# Patient Record
Sex: Male | Born: 1950
Health system: Southern US, Community
[De-identification: ages and names within clinical notes are randomized; demographics above are authoritative.]

## PROBLEM LIST (undated history)

## (undated) DIAGNOSIS — E119 Type 2 diabetes mellitus without complications: Secondary | ICD-10-CM

## (undated) DIAGNOSIS — F419 Anxiety disorder, unspecified: Secondary | ICD-10-CM

## (undated) DIAGNOSIS — K219 Gastro-esophageal reflux disease without esophagitis: Secondary | ICD-10-CM

## (undated) DIAGNOSIS — T8859XA Other complications of anesthesia, initial encounter: Secondary | ICD-10-CM

## (undated) DIAGNOSIS — E039 Hypothyroidism, unspecified: Secondary | ICD-10-CM

## (undated) DIAGNOSIS — E079 Disorder of thyroid, unspecified: Secondary | ICD-10-CM

## (undated) DIAGNOSIS — M1990 Unspecified osteoarthritis, unspecified site: Secondary | ICD-10-CM

## (undated) DIAGNOSIS — E7800 Pure hypercholesterolemia, unspecified: Secondary | ICD-10-CM

## (undated) DIAGNOSIS — I10 Essential (primary) hypertension: Secondary | ICD-10-CM

## (undated) DIAGNOSIS — G4730 Sleep apnea, unspecified: Secondary | ICD-10-CM

## (undated) DIAGNOSIS — I639 Cerebral infarction, unspecified: Secondary | ICD-10-CM

## (undated) DIAGNOSIS — J189 Pneumonia, unspecified organism: Secondary | ICD-10-CM

## (undated) DIAGNOSIS — F329 Major depressive disorder, single episode, unspecified: Secondary | ICD-10-CM

## (undated) DIAGNOSIS — F32A Depression, unspecified: Secondary | ICD-10-CM

## (undated) DIAGNOSIS — Z87442 Personal history of urinary calculi: Secondary | ICD-10-CM

## (undated) DIAGNOSIS — T4145XA Adverse effect of unspecified anesthetic, initial encounter: Secondary | ICD-10-CM

## (undated) HISTORY — PX: OTHER SURGICAL HISTORY: SHX169

## (undated) HISTORY — DX: Cerebral infarction, unspecified: I63.9

## (undated) HISTORY — PX: KIDNEY STONE SURGERY: SHX686

---

## 2001-05-29 ENCOUNTER — Emergency Department (HOSPITAL_COMMUNITY): Admission: EM | Admit: 2001-05-29 | Discharge: 2001-05-29 | Payer: Self-pay | Admitting: *Deleted

## 2001-05-30 ENCOUNTER — Other Ambulatory Visit (HOSPITAL_COMMUNITY): Admission: RE | Admit: 2001-05-30 | Discharge: 2001-06-03 | Payer: Self-pay | Admitting: Psychiatry

## 2001-06-28 ENCOUNTER — Inpatient Hospital Stay (HOSPITAL_COMMUNITY): Admission: EM | Admit: 2001-06-28 | Discharge: 2001-07-02 | Payer: Self-pay | Admitting: Psychiatry

## 2001-07-01 ENCOUNTER — Ambulatory Visit (HOSPITAL_COMMUNITY): Admission: RE | Admit: 2001-07-01 | Discharge: 2001-07-01 | Payer: Self-pay | Admitting: Psychiatry

## 2001-07-01 ENCOUNTER — Encounter (HOSPITAL_COMMUNITY): Payer: Self-pay | Admitting: Psychiatry

## 2003-08-14 ENCOUNTER — Inpatient Hospital Stay (HOSPITAL_COMMUNITY): Admission: EM | Admit: 2003-08-14 | Discharge: 2003-08-14 | Payer: Self-pay | Admitting: Emergency Medicine

## 2003-08-27 ENCOUNTER — Encounter: Admission: RE | Admit: 2003-08-27 | Discharge: 2003-08-27 | Payer: Self-pay | Admitting: Family Medicine

## 2004-02-29 ENCOUNTER — Ambulatory Visit (HOSPITAL_COMMUNITY): Admission: RE | Admit: 2004-02-29 | Discharge: 2004-02-29 | Payer: Self-pay | Admitting: Otolaryngology

## 2005-03-04 ENCOUNTER — Other Ambulatory Visit: Admission: RE | Admit: 2005-03-04 | Discharge: 2005-03-04 | Payer: Self-pay | Admitting: Dermatology

## 2010-05-17 ENCOUNTER — Encounter: Payer: Self-pay | Admitting: Otolaryngology

## 2011-04-28 HISTORY — PX: BACK SURGERY: SHX140

## 2012-09-14 ENCOUNTER — Emergency Department (HOSPITAL_COMMUNITY)
Admission: EM | Admit: 2012-09-14 | Discharge: 2012-09-14 | Disposition: A | Discharge status: Home / Self Care | Payer: Worker's Compensation | Attending: Emergency Medicine | Admitting: Emergency Medicine

## 2012-09-14 ENCOUNTER — Encounter (HOSPITAL_COMMUNITY): Payer: Self-pay | Admitting: Family Medicine

## 2012-09-14 DIAGNOSIS — M545 Low back pain, unspecified: Secondary | ICD-10-CM | POA: Insufficient documentation

## 2012-09-14 DIAGNOSIS — Z862 Personal history of diseases of the blood and blood-forming organs and certain disorders involving the immune mechanism: Secondary | ICD-10-CM | POA: Insufficient documentation

## 2012-09-14 DIAGNOSIS — Z79899 Other long term (current) drug therapy: Secondary | ICD-10-CM | POA: Insufficient documentation

## 2012-09-14 DIAGNOSIS — Z9889 Other specified postprocedural states: Secondary | ICD-10-CM | POA: Insufficient documentation

## 2012-09-14 DIAGNOSIS — I10 Essential (primary) hypertension: Secondary | ICD-10-CM | POA: Insufficient documentation

## 2012-09-14 DIAGNOSIS — E7800 Pure hypercholesterolemia, unspecified: Secondary | ICD-10-CM | POA: Insufficient documentation

## 2012-09-14 DIAGNOSIS — F17200 Nicotine dependence, unspecified, uncomplicated: Secondary | ICD-10-CM | POA: Insufficient documentation

## 2012-09-14 DIAGNOSIS — G8921 Chronic pain due to trauma: Secondary | ICD-10-CM | POA: Insufficient documentation

## 2012-09-14 DIAGNOSIS — Z8639 Personal history of other endocrine, nutritional and metabolic disease: Secondary | ICD-10-CM | POA: Insufficient documentation

## 2012-09-14 DIAGNOSIS — M549 Dorsalgia, unspecified: Secondary | ICD-10-CM

## 2012-09-14 HISTORY — DX: Disorder of thyroid, unspecified: E07.9

## 2012-09-14 HISTORY — DX: Pure hypercholesterolemia, unspecified: E78.00

## 2012-09-14 HISTORY — DX: Essential (primary) hypertension: I10

## 2012-09-14 MED ORDER — OXYCODONE-ACETAMINOPHEN 5-325 MG PO TABS
1.0000 | ORAL_TABLET | Freq: Once | ORAL | Status: AC
  Administered 2012-09-14: 1 via ORAL
  Filled 2012-09-14 (×2): qty 1

## 2012-09-14 MED ORDER — OXYCODONE-ACETAMINOPHEN 5-325 MG PO TABS: 2.0000 | ORAL_TABLET | Freq: Four times a day (QID) | ORAL | Status: DC | PRN

## 2012-09-14 MED ORDER — KETOROLAC TROMETHAMINE 60 MG/2ML IM SOLN
60.0000 mg | Freq: Once | INTRAMUSCULAR | Status: AC
Start: 2012-09-14 — End: 2012-09-14
  Administered 2012-09-14: 60 mg via INTRAMUSCULAR
  Filled 2012-09-14 (×2): qty 2

## 2012-09-14 NOTE — ED Notes (Addendum)
Pt appears to be in severe pain. Lower back to right leg. Denies dumbness or tingling. Wife states "he almost passed out this morning". Pt is involved in a workman's comp injury in April. States had a previous WC injury in 2012.

## 2012-09-14 NOTE — ED Notes (Signed)
Pt dc to home.  Pt sts understanding to dc instructions. Pt ambulatory to exit without difficulty. Denies need for w/c. 

## 2012-09-14 NOTE — ED Notes (Signed)
Pt has had back injury since April and has had multiple injections. sts this morning bent over and the pain is worse. sts lower back pain and right hip pain.

## 2012-09-14 NOTE — ED Provider Notes (Signed)
History    This chart was scribed for non-physician practitioner, Roxy Horseman PA-C working with Vida Roller, MD by Donne Anon, ED Scribe. This patient was seen in room TR09C/TR09C and the patient's care was started at 1733.   CSN: [id number]  Arrival date & time 09/14/12  1637   First MD Initiated Contact with Patient 09/14/12 1733      Chief Complaint  Patient presents with  . Back Pain    The history is provided by the patient and the spouse. No language interpreter was used.   HPI Comments: Mathew Bernard is a 62 y.o. male who presents to the Emergency Department complaining of 2 years of gradual onset, gradually worsening, chronic severe back pain that radiates into his right leg and became exacerbated when he bent over to tie his shoe this morning. He states he injured his back at work in the fall of 2012 and has had pain since. He states walking makes the pain worse and pulling his legs to his chest relieve the pain. He denies incontinence. He denies any recent falls or pain that radiates down his legs. He has tried Prednisone and ibuprofen with little relief. He has had kidney stones in the past.  He saw Dr. Jillyn Hidden in 2012 for this pain.  Past Medical History  Diagnosis Date  . Hypertension   . High cholesterol   . Thyroid disease     Past Surgical History  Procedure Laterality Date  . Back surgery      History reviewed. No pertinent family history.  History  Substance Use Topics  . Smoking status: Current Every Day Smoker  . Smokeless tobacco: Not on file  . Alcohol Use: No      Review of Systems  Musculoskeletal: Positive for back pain.  All other systems reviewed and are negative.    Allergies  Codeine  Home Medications   Current Outpatient Rx  Name  Route  Sig  Dispense  Refill  . amLODipine-benazepril (LOTREL) 5-10 MG per capsule   Oral   Take 1 capsule by mouth at bedtime.         . cyanocobalamin 500 MCG tablet   Oral   Take 500  mcg by mouth daily.         . fish oil-omega-3 fatty acids 1000 MG capsule   Oral   Take 1 g by mouth daily.         . fluticasone (FLONASE) 50 MCG/ACT nasal spray   Nasal   Place 2 sprays into the nose daily.         Marland Kitchen ibuprofen (ADVIL,MOTRIN) 200 MG tablet   Oral   Take 200-800 mg by mouth every 6 (six) hours as needed for pain, fever or headache.         Marland Kitchen omeprazole (PRILOSEC) 20 MG capsule   Oral   Take 20 mg by mouth 2 (two) times daily.         . simvastatin (ZOCOR) 10 MG tablet   Oral   Take 5 mg by mouth at bedtime.         . predniSONE (STERAPRED UNI-PAK) 10 MG tablet   Oral   Take 10-60 mg by mouth See admin instructions. Taper dose 60-10mg          . sertraline (ZOLOFT) 100 MG tablet   Oral   Take 150 mg by mouth at bedtime.           BP 147/86  Pulse 62  Temp(Src) 97.5 F (36.4 C) (Oral)  Resp 24  SpO2 98%  Physical Exam  Nursing note and vitals reviewed. Constitutional: He is oriented to person, place, and time. He appears well-developed and well-nourished. No distress.  HENT:  Head: Normocephalic and atraumatic.  Eyes: Conjunctivae and EOM are normal. Right eye exhibits no discharge. Left eye exhibits no discharge. No scleral icterus.  Neck: Normal range of motion. Neck supple. No tracheal deviation present.  Cardiovascular: Normal rate, regular rhythm and normal heart sounds.  Exam reveals no gallop and no friction rub.   No murmur heard. Pulmonary/Chest: Effort normal and breath sounds normal. No respiratory distress. He has no wheezes.  Abdominal: Soft. He exhibits no distension. There is no tenderness.  Musculoskeletal: Normal range of motion.  Lumbar paraspinal muscles tender to palpation, no bony tenderness, step-offs, or gross abnormality or deformity of spine, patient is able to ambulate, moves all extremities  Neurological: He is alert and oriented to person, place, and time.  Sensation and strength intact bilaterally  Skin:  Skin is warm and dry. He is not diaphoretic.  Psychiatric: He has a normal mood and affect. His behavior is normal. Judgment and thought content normal.    ED Course  Procedures (including critical care time) DIAGNOSTIC STUDIES: Oxygen Saturation is 98% on room air, normal by my interpretation.    COORDINATION OF CARE: 6:51 PM Discussed treatment plan which includes Toradol and Percocet with pt at bedside and pt agreed to plan. Referral to orthopedics given.  7:51 PM rechecked pt who reports improvement upon treatment.       1. Back pain       MDM  Patient with back pain.  No neurological deficits and normal neuro exam.  Patient can walk but states is painful.  No loss of bowel or bladder control.  No concern for cauda equina.  No fever, night sweats, weight loss, h/o cancer, IVDU.  RICE protocol and pain medicine indicated and discussed with patient.    I personally performed the services described in this documentation, which was scribed in my presence. The recorded information has been reviewed and is accurate.         Roxy Horseman, PA-C 09/14/12 2340

## 2012-09-14 NOTE — ED Notes (Signed)
PA at bedside.

## 2012-09-14 NOTE — Discharge Instructions (Signed)
Back Pain, Adult  Low back pain is very common. About 1 in 5 people have back pain. The cause of low back pain is rarely dangerous. The pain often gets better over time. About half of people with a sudden onset of back pain feel better in just 2 weeks. About 8 in 10 people feel better by 6 weeks.   CAUSES  Some common causes of back pain include:  · Strain of the muscles or ligaments supporting the spine.  · Wear and tear (degeneration) of the spinal discs.  · Arthritis.  · Direct injury to the back.  DIAGNOSIS  Most of the time, the direct cause of low back pain is not known. However, back pain can be treated effectively even when the exact cause of the pain is unknown. Answering your caregiver's questions about your overall health and symptoms is one of the most accurate ways to make sure the cause of your pain is not dangerous. If your caregiver needs more information, he or she may order lab work or imaging tests (X-rays or MRIs). However, even if imaging tests show changes in your back, this usually does not require surgery.  HOME CARE INSTRUCTIONS  For many people, back pain returns. Since low back pain is rarely dangerous, it is often a condition that people can learn to manage on their own.   · Remain active. It is stressful on the back to sit or stand in one place. Do not sit, drive, or stand in one place for more than 30 minutes at a time. Take short walks on level surfaces as soon as pain allows. Try to increase the length of time you walk each day.  · Do not stay in bed. Resting more than 1 or 2 days can delay your recovery.  · Do not avoid exercise or work. Your body is made to move. It is not dangerous to be active, even though your back may hurt. Your back will likely heal faster if you return to being active before your pain is gone.  · Pay attention to your body when you  bend and lift. Many people have less discomfort when lifting if they bend their knees, keep the load close to their bodies, and  avoid twisting. Often, the most comfortable positions are those that put less stress on your recovering back.  · Find a comfortable position to sleep. Use a firm mattress and lie on your side with your knees slightly bent. If you lie on your back, put a pillow under your knees.  · Only take over-the-counter or prescription medicines as directed by your caregiver. Over-the-counter medicines to reduce pain and inflammation are often the most helpful. Your caregiver may prescribe muscle relaxant drugs. These medicines help dull your pain so you can more quickly return to your normal activities and healthy exercise.  · Put ice on the injured area.  · Put ice in a plastic bag.  · Place a towel between your skin and the bag.  · Leave the ice on for 15-20 minutes, 3-4 times a day for the first 2 to 3 days. After that, ice and heat may be alternated to reduce pain and spasms.  · Ask your caregiver about trying back exercises and gentle massage. This may be of some benefit.  · Avoid feeling anxious or stressed. Stress increases muscle tension and can worsen back pain. It is important to recognize when you are anxious or stressed and learn ways to manage it. Exercise is a great option.  SEEK MEDICAL CARE IF:  · You have pain that is not relieved with rest or   medicine.  · You have pain that does not improve in 1 week.  · You have new symptoms.  · You are generally not feeling well.  SEEK IMMEDIATE MEDICAL CARE IF:   · You have pain that radiates from your back into your legs.  · You develop new bowel or bladder control problems.  · You have unusual weakness or numbness in your arms or legs.  · You develop nausea or vomiting.  · You develop abdominal pain.  · You feel faint.  Document Released: 04/13/2005 Document Revised: 10/13/2011 Document Reviewed: 09/01/2010  [employer]® Patient Information ©2014 [employer].

## 2012-09-15 NOTE — ED Provider Notes (Signed)
Medical screening examination/treatment/procedure(s) were performed by non-physician practitioner and as supervising physician I was immediately available for consultation/collaboration.    Vida Roller, MD 09/15/12 1357

## 2012-09-17 ENCOUNTER — Emergency Department (HOSPITAL_COMMUNITY): Payer: Worker's Compensation

## 2012-09-17 ENCOUNTER — Encounter (HOSPITAL_COMMUNITY): Payer: Self-pay | Admitting: *Deleted

## 2012-09-17 ENCOUNTER — Emergency Department (HOSPITAL_COMMUNITY)
Admission: EM | Admit: 2012-09-17 | Discharge: 2012-09-17 | Disposition: A | Discharge status: Home / Self Care | Payer: Worker's Compensation | Attending: Emergency Medicine | Admitting: Emergency Medicine

## 2012-09-17 DIAGNOSIS — Z9889 Other specified postprocedural states: Secondary | ICD-10-CM | POA: Insufficient documentation

## 2012-09-17 DIAGNOSIS — Z8639 Personal history of other endocrine, nutritional and metabolic disease: Secondary | ICD-10-CM | POA: Insufficient documentation

## 2012-09-17 DIAGNOSIS — E7800 Pure hypercholesterolemia, unspecified: Secondary | ICD-10-CM | POA: Insufficient documentation

## 2012-09-17 DIAGNOSIS — Z79899 Other long term (current) drug therapy: Secondary | ICD-10-CM | POA: Insufficient documentation

## 2012-09-17 DIAGNOSIS — IMO0002 Reserved for concepts with insufficient information to code with codable children: Secondary | ICD-10-CM | POA: Insufficient documentation

## 2012-09-17 DIAGNOSIS — F17200 Nicotine dependence, unspecified, uncomplicated: Secondary | ICD-10-CM | POA: Insufficient documentation

## 2012-09-17 DIAGNOSIS — M5417 Radiculopathy, lumbosacral region: Secondary | ICD-10-CM

## 2012-09-17 DIAGNOSIS — Z862 Personal history of diseases of the blood and blood-forming organs and certain disorders involving the immune mechanism: Secondary | ICD-10-CM | POA: Insufficient documentation

## 2012-09-17 DIAGNOSIS — I10 Essential (primary) hypertension: Secondary | ICD-10-CM | POA: Insufficient documentation

## 2012-09-17 DIAGNOSIS — R209 Unspecified disturbances of skin sensation: Secondary | ICD-10-CM | POA: Insufficient documentation

## 2012-09-17 LAB — URINALYSIS, ROUTINE W REFLEX MICROSCOPIC
Glucose, UA: NEGATIVE mg/dL
Leukocytes, UA: NEGATIVE
Nitrite: NEGATIVE
Specific Gravity, Urine: >1.03 — ABNORMAL HIGH (ref 1.005–1.030)
pH: 6 (ref 5.0–8.0)

## 2012-09-17 MED ORDER — DOCUSATE SODIUM 100 MG PO CAPS: 100.0000 mg | ORAL_CAPSULE | Freq: Two times a day (BID) | ORAL | Status: DC

## 2012-09-17 MED ORDER — HYDROMORPHONE HCL PF 1 MG/ML IJ SOLN
1.0000 mg | Freq: Once | INTRAMUSCULAR | Status: AC
  Administered 2012-09-17: 1 mg via INTRAVENOUS
  Filled 2012-09-17: qty 1

## 2012-09-17 MED ORDER — HYDROMORPHONE HCL PF 1 MG/ML IJ SOLN
1.0000 mg | Freq: Once | INTRAMUSCULAR | Status: AC
  Administered 2012-09-17: 1 mg via INTRAVENOUS

## 2012-09-17 MED ORDER — CYCLOBENZAPRINE HCL 10 MG PO TABS: 10.0000 mg | ORAL_TABLET | Freq: Two times a day (BID) | ORAL | Status: DC | PRN

## 2012-09-17 NOTE — ED Notes (Signed)
Lower back pain radiating down right leg.

## 2012-09-17 NOTE — ED Provider Notes (Signed)
History     CSN: [id number]  Arrival date & time 09/17/12  0039   First MD Initiated Contact with Patient 09/17/12 0055      Chief Complaint  Patient presents with  . Back Pain     Patient is a 62 y.o. male presenting with back pain. The history is provided by the patient, the spouse and a relative.  Back Pain Location:  Lumbar spine Quality:  Aching Radiates to:  R thigh Pain severity:  Severe Pain is:  Same all the time Onset quality:  Gradual Duration: several days ago. Timing:  Constant Progression:  Worsening Relieved by:  Nothing Worsened by:  Ambulation and movement Associated symptoms: numbness   Associated symptoms: no abdominal pain, no bladder incontinence, no bowel incontinence, no chest pain, no dysuria, no fever, no perianal numbness and no weakness   Risk factors: no hx of cancer   pt reports he has long h/o back pain. He reports that end of April 2014 he picked up something at work that triggered return of back pain He reports that on 5/21 he bent over to tie shoes and this aggravated his pain He was seen in the ED on 5/21 and then seen by his orthopedist dr Shelle Iron since that time Plan is for MRI of spine next week He reports h/o neck surgery but no low back surgery No recent trauma to back No h/o cancer No falls Reports pain/numbness into right leg mostly on lateral aspect of leg   Past Medical History  Diagnosis Date  . Hypertension   . High cholesterol   . Thyroid disease     Past Surgical History  Procedure Laterality Date  . Back surgery      History reviewed. No pertinent family history.  History  Substance Use Topics  . Smoking status: Current Every Day Smoker  . Smokeless tobacco: Not on file  . Alcohol Use: No      Review of Systems  Constitutional: Negative for fever.  Respiratory: Negative for shortness of breath.   Cardiovascular: Negative for chest pain.  Gastrointestinal: Negative for vomiting, abdominal pain and bowel  incontinence.  Genitourinary: Negative for bladder incontinence, dysuria and difficulty urinating.  Musculoskeletal: Positive for back pain.  Neurological: Positive for numbness. Negative for weakness.  All other systems reviewed and are negative.    Allergies  Codeine  Home Medications   Current Outpatient Rx  Name  Route  Sig  Dispense  Refill  . amLODipine-benazepril (LOTREL) 5-10 MG per capsule   Oral   Take 1 capsule by mouth at bedtime.         . cyanocobalamin 500 MCG tablet   Oral   Take 500 mcg by mouth daily.         . cyclobenzaprine (FLEXERIL) 10 MG tablet   Oral   Take 1 tablet (10 mg total) by mouth 2 (two) times daily as needed for muscle spasms.   20 tablet   0   . docusate sodium (COLACE) 100 MG capsule   Oral   Take 1 capsule (100 mg total) by mouth every 12 (twelve) hours.   60 capsule   0   . fish oil-omega-3 fatty acids 1000 MG capsule   Oral   Take 1 g by mouth daily.         . fluticasone (FLONASE) 50 MCG/ACT nasal spray   Nasal   Place 2 sprays into the nose daily.         Marland Kitchen  ibuprofen (ADVIL,MOTRIN) 200 MG tablet   Oral   Take 200-800 mg by mouth every 6 (six) hours as needed for pain, fever or headache.         Marland Kitchen omeprazole (PRILOSEC) 20 MG capsule   Oral   Take 20 mg by mouth 2 (two) times daily.         Marland Kitchen oxyCODONE-acetaminophen (PERCOCET/ROXICET) 5-325 MG per tablet   Oral   Take 2 tablets by mouth every 6 (six) hours as needed for pain.   15 tablet   0   . predniSONE (STERAPRED UNI-PAK) 10 MG tablet   Oral   Take 10-60 mg by mouth See admin instructions. Taper dose 60-10mg          . sertraline (ZOLOFT) 100 MG tablet   Oral   Take 150 mg by mouth at bedtime.         . simvastatin (ZOCOR) 10 MG tablet   Oral   Take 5 mg by mouth at bedtime.           BP 155/90  Pulse 55  Temp(Src) 97.8 F (36.6 C) (Oral)  Resp 22  Ht 5\' 8"  (1.727 m)  Wt 220 lb (99.791 kg)  BMI 33.46 kg/m2  SpO2 94%  Physical  Exam CONSTITUTIONAL: Well developed/well nourished HEAD: Normocephalic/atraumatic EYES: EOMI/PERRL ENMT: Mucous membranes moist NECK: supple no meningeal signs SPINE:entire spine nontender, lumbar paraspinal tenderness No bruising/crepitance/stepoffs noted to spine CV: S1/S2 noted, no murmurs/rubs/gallops noted LUNGS: Lungs are clear to auscultation bilaterally, no apparent distress ABDOMEN: soft, nontender, no rebound or guarding GU:no cva tenderness NEURO: Awake/alert, no saddle anesthesia,equal distal motor: hip flexion/knee flexion/extension, ankle dorsi/plantar flexion, great toe extension intact bilaterally, no clonus bilaterally, plantar reflex appropriate,  Equal patellar/achilles reflex noted.  Pt is able to ambulate. He reports burning numbness along lateral aspect of right thigh/calf EXTREMITIES: pulses normal, full ROM SKIN: warm, color normal PSYCH: no abnormalities of mood noted   ED Course  Procedures   Labs Reviewed  URINALYSIS, ROUTINE W REFLEX MICROSCOPIC - Abnormal; Notable for the following:    Specific Gravity, Urine >1.030 (*)    All other components within normal limits   Dg Lumbar Spine Complete  09/17/2012   *RADIOLOGY REPORT*  Clinical Data: Low back pain  LUMBAR SPINE - COMPLETE 4+ VIEW  Comparison: CT 11/06/2011  Findings: Five non-rib bearing lumbar type vertebral bodies are identified.  Normal alignment.  Atherosclerotic aortic calcification again noted.  No calcified aneurysm.  Vertebral body heights are preserved.  No malalignment.  IMPRESSION: Normal exam.   Original Report Authenticated By: Christiana Pellant, M.D.     1. Lumbosacral radiculopathy    I suspect worsening lumbosacral radiculopathy He has no focal motor deficits IV pain meds given here.  He is ambulatory He has pain meds at home (given 7/5/325 percocet by his orthopedist) I advised he can increase to two tabs q 6-8 hours and to hold if feels sedated.  He also has robaxin at home which  is not helping Will add on flexeril but advised of sedation and to be cautious when taking with narcotics and not take with robaxin I do not feel he needs emergent MRI or neurosurgical consulation He has f/u next week with his orthopedist I doubt acute abdominal/vascular emergency at this time   MDM  Nursing notes including past medical history and social history reviewed and considered in documentation xrays reviewed and considered Labs/vital reviewed and considered Previous records reviewed and considered - recent ED  visit reviewed         Joya Gaskins, MD 09/17/12 [phone]

## 2012-10-03 ENCOUNTER — Other Ambulatory Visit: Payer: Self-pay | Admitting: Orthopedic Surgery

## 2012-10-03 ENCOUNTER — Encounter (HOSPITAL_COMMUNITY): Payer: Self-pay | Admitting: Pharmacy Technician

## 2012-10-03 NOTE — H&P (Signed)
Mathew Bernard is an 62 y.o. male.   Chief Complaint: back and right leg pain HPI: The patient reports low back pain which began 6 weeks ago following a specific injury (08/22/12). The injury occurred at work due to twisting, lifting and bending. The pain radiates to the right posterior thigh. The patient describes the pain as sharp and severe. The patient feels as if the symptoms are worsening. Symptoms are exacerbated by bending. Current treatment includes ice packs and TENS unit. He was seen by a doctor in [place] at [facility] and recieved two steroid injection of to the lumbar spine. Symptoms refractory to Percocet, activity modification, ESI x 2, relative rest  Past Medical History  Diagnosis Date  . Hypertension   . High cholesterol   . Thyroid disease     Past Surgical History  Procedure Laterality Date  . Back surgery      No family history on file. Social History:  reports that he has been smoking.  He does not have any smokeless tobacco history on file. He reports that he does not drink alcohol. His drug history is not on file.  Allergies:  Allergies  Allergen Reactions  . Codeine     Jittery      (Not in a hospital admission)  No results found for this or any previous visit (from the past 48 hour(s)). No results found.  Review of Systems  Constitutional: Negative.   Eyes: Negative.   Respiratory: Negative.   Cardiovascular: Negative.   Gastrointestinal: Negative.   Genitourinary: Negative.   Musculoskeletal: Positive for back pain.  Skin: Negative.   Neurological: Positive for sensory change and focal weakness.  Psychiatric/Behavioral: Negative.     There were no vitals taken for this visit. Physical Exam  Constitutional: He is oriented to person, place, and time. He appears well-developed and well-nourished. He appears distressed.  HENT:  Head: Normocephalic and atraumatic.  Eyes: Conjunctivae and EOM are normal. Pupils are equal, round, and reactive to  light.  Neck: Normal range of motion. Neck supple.  Cardiovascular: Normal rate and regular rhythm.   Respiratory: Effort normal and breath sounds normal.  GI: Soft. Bowel sounds are normal.  Musculoskeletal:  The patient is in moderate distress. Mood and affect are appropriate. Straight leg raise on the right produces buttock, thigh, and calf pain, exacerbated with dorsal argumentation maneuver, negative on the left. EHL is 4+/5. Decreased sensation L5 dermatome.  Lumbar spine exam reveals no evidence of soft tissue swelling, no evidence of soft tissue swelling or deformity or skin ecchymosis. On palpation there is no tenderness of the lumbar spine. No flank pain with percussion. The abdomen is soft and nontender. Nontender over the trochanters. No cellulitis or lymphadenopathy.   Good range of motion of the lumbar spine without associated pain. Motor is 5/5 including tibialis anterior, plantarflexion, quadriceps and hamstrings. The patient is normoreflexic. There is no Babinski or clonus. The patient has good distal pulses. No DVT. No pain and normal range of motion without instability of the hips, knees and ankles.  Neurological: He is alert and oriented to person, place, and time. He has normal reflexes.  Skin: Skin is warm and dry.  Psychiatric: He has a normal mood and affect.     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  After an extensive discussion concerning pathology, relevant anatomy, and treatment options at this point in the presence of neurological deficit failing   conservative treatment in the presence of a neural compressive lesion, we discussed lumbar decompression.  We discussed two weeks postoperatively suture removal, physical therapy within two weeks up until six weeks two to three times a week. At the six week mark then engage in a work conditioning  program to a twelve week postoperative mark at which point he will be eligible for maximum medical improvement. I do feel this is related to his initial injury given his twisting and lifting.  I had an extensive discussion of the risks and benefits of lumbar decompression with the patient including bleeding, infection, damage to neurovascular structures, epidural fibrosis, CSF leakage requiring repair. We also discussed increase in pain, adjacent segment disease, recurrent disc herniation, need for future surgery including repeat decompression and/or fusion. We also discussed risks of postoperative hematoma, paralysis, anesthetic complications including DVT, PE, death, cardiopulmonary dysfunction. In addition, the perioperative and postoperative courses were discussed in detail including the rehabilitative time and return to functional activity and work. I provided the patient with an illustrated handout and utilized the appropriate surgical models.  Plan microlumbar decompression L4-5  BISSELL, JACLYN M. for Dr. Beane 10/03/2012, 9:28 AM    

## 2012-10-03 NOTE — Progress Notes (Signed)
LOV note 09/28/12 Dr. Neita Carp on chart, chest x-ray 03/25/12 on chart.

## 2012-10-03 NOTE — Patient Instructions (Addendum)
[address] Juba Morken  10/03/2012   Your procedure is scheduled on: 10/07/12  Report to [facility] at 12:00 PM.  Call this number if you have problems the morning of surgery [phone]: [phone]   Remember: please bring inhaler on day of surgery    Do not eat food After Midnight, clear liquids from midnight until 8:30 am on 10/07/12 then nothing.      Take these medicines the morning of surgery with A SIP OF WATER: omeprazole, synthroid, zyrtec, oxycodone if needed   Do not wear jewelry, make-up or nail polish.  Do not wear lotions, powders, or perfumes. You may wear deodorant.  Do not shave 48 hours prior to surgery. Men may shave face and neck.  Do not bring valuables to the hospital.  Contacts, dentures or bridgework may not be worn into surgery.  Leave suitcase in the car. After surgery it may be brought to your room.  For patients admitted to the hospital, checkout time is 11:00 AM the day of discharge.    Please read over the following fact sheets that you were given: MRSA Information.  Birdie Sons, RN  pre op nurse call if needed [phone]    FAILURE TO FOLLOW THESE INSTRUCTIONS MAY RESULT IN CANCELLATION OF YOUR SURGERY   Patient Signature: ___________________________________________

## 2012-10-03 NOTE — Pre-Procedure Instructions (Signed)
Please put oreders in Epic as patient has Pre-op appointment on 10/04/2012 at 10am.! Thank you!

## 2012-10-04 ENCOUNTER — Ambulatory Visit (HOSPITAL_COMMUNITY)
Admission: RE | Admit: 2012-10-04 | Discharge: 2012-10-04 | Disposition: A | Discharge status: Home / Self Care | Payer: Worker's Compensation | Source: Ambulatory Visit | Attending: Orthopedic Surgery | Admitting: Orthopedic Surgery

## 2012-10-04 ENCOUNTER — Encounter (HOSPITAL_COMMUNITY)
Admission: RE | Admit: 2012-10-04 | Discharge: 2012-10-04 | Disposition: A | Discharge status: Home / Self Care | Payer: Worker's Compensation | Source: Ambulatory Visit | Attending: Specialist | Admitting: Specialist

## 2012-10-04 ENCOUNTER — Encounter (HOSPITAL_COMMUNITY): Payer: Self-pay

## 2012-10-04 DIAGNOSIS — Z01812 Encounter for preprocedural laboratory examination: Secondary | ICD-10-CM | POA: Insufficient documentation

## 2012-10-04 DIAGNOSIS — Z01818 Encounter for other preprocedural examination: Secondary | ICD-10-CM | POA: Insufficient documentation

## 2012-10-04 DIAGNOSIS — Z01810 Encounter for preprocedural cardiovascular examination: Secondary | ICD-10-CM | POA: Insufficient documentation

## 2012-10-04 DIAGNOSIS — M5137 Other intervertebral disc degeneration, lumbosacral region: Secondary | ICD-10-CM | POA: Insufficient documentation

## 2012-10-04 DIAGNOSIS — M51379 Other intervertebral disc degeneration, lumbosacral region without mention of lumbar back pain or lower extremity pain: Secondary | ICD-10-CM | POA: Insufficient documentation

## 2012-10-04 HISTORY — DX: Hypothyroidism, unspecified: E03.9

## 2012-10-04 HISTORY — DX: Adverse effect of unspecified anesthetic, initial encounter: T41.45XA

## 2012-10-04 HISTORY — DX: Sleep apnea, unspecified: G47.30

## 2012-10-04 HISTORY — DX: Pneumonia, unspecified organism: J18.9

## 2012-10-04 HISTORY — DX: Other complications of anesthesia, initial encounter: T88.59XA

## 2012-10-04 HISTORY — DX: Personal history of urinary calculi: Z87.442

## 2012-10-04 HISTORY — DX: Anxiety disorder, unspecified: F41.9

## 2012-10-04 HISTORY — DX: Unspecified osteoarthritis, unspecified site: M19.90

## 2012-10-04 HISTORY — DX: Major depressive disorder, single episode, unspecified: F32.9

## 2012-10-04 HISTORY — DX: Depression, unspecified: F32.A

## 2012-10-04 HISTORY — DX: Gastro-esophageal reflux disease without esophagitis: K21.9

## 2012-10-04 LAB — CBC
HCT: 44.9 % (ref 39.0–52.0)
MCV: 82.8 fL (ref 78.0–100.0)
RBC: 5.42 MIL/uL (ref 4.22–5.81)
RDW: 14.6 % (ref 11.5–15.5)
WBC: 6.4 10*3/uL (ref 4.0–10.5)

## 2012-10-04 LAB — BASIC METABOLIC PANEL
CO2: 27 mEq/L (ref 19–32)
Chloride: 103 mEq/L (ref 96–112)
GFR calc Af Amer: 75 mL/min — ABNORMAL LOW (ref >90)
Potassium: 4.7 mEq/L (ref 3.5–5.1)
Sodium: 137 mEq/L (ref 135–145)

## 2012-10-04 LAB — SURGICAL PCR SCREEN: Staphylococcus aureus: NEGATIVE

## 2012-10-07 ENCOUNTER — Ambulatory Visit (HOSPITAL_COMMUNITY)
Admission: RE | Admit: 2012-10-07 | Discharge: 2012-10-08 | Disposition: A | Discharge status: Home / Self Care | Payer: Worker's Compensation | Source: Ambulatory Visit | Attending: Specialist | Admitting: Specialist

## 2012-10-07 ENCOUNTER — Ambulatory Visit (HOSPITAL_COMMUNITY): Payer: Worker's Compensation

## 2012-10-07 ENCOUNTER — Encounter (HOSPITAL_COMMUNITY)
Admission: RE | Disposition: A | Discharge status: Home / Self Care | Payer: Self-pay | Source: Ambulatory Visit | Attending: Specialist

## 2012-10-07 ENCOUNTER — Ambulatory Visit (HOSPITAL_COMMUNITY): Payer: Worker's Compensation | Admitting: Anesthesiology

## 2012-10-07 ENCOUNTER — Encounter (HOSPITAL_COMMUNITY): Payer: Self-pay | Admitting: Anesthesiology

## 2012-10-07 ENCOUNTER — Encounter (HOSPITAL_COMMUNITY): Payer: Self-pay

## 2012-10-07 DIAGNOSIS — E079 Disorder of thyroid, unspecified: Secondary | ICD-10-CM | POA: Insufficient documentation

## 2012-10-07 DIAGNOSIS — M5126 Other intervertebral disc displacement, lumbar region: Secondary | ICD-10-CM

## 2012-10-07 DIAGNOSIS — E7800 Pure hypercholesterolemia, unspecified: Secondary | ICD-10-CM | POA: Insufficient documentation

## 2012-10-07 DIAGNOSIS — F17200 Nicotine dependence, unspecified, uncomplicated: Secondary | ICD-10-CM | POA: Insufficient documentation

## 2012-10-07 DIAGNOSIS — I10 Essential (primary) hypertension: Secondary | ICD-10-CM | POA: Insufficient documentation

## 2012-10-07 DIAGNOSIS — G4730 Sleep apnea, unspecified: Secondary | ICD-10-CM | POA: Insufficient documentation

## 2012-10-07 DIAGNOSIS — E669 Obesity, unspecified: Secondary | ICD-10-CM | POA: Insufficient documentation

## 2012-10-07 DIAGNOSIS — K219 Gastro-esophageal reflux disease without esophagitis: Secondary | ICD-10-CM | POA: Insufficient documentation

## 2012-10-07 HISTORY — PX: LUMBAR LAMINECTOMY/DECOMPRESSION MICRODISCECTOMY: SHX5026

## 2012-10-07 SURGERY — LUMBAR LAMINECTOMY/DECOMPRESSION MICRODISCECTOMY 1 LEVEL
Anesthesia: General | Site: Back | Wound class: Clean

## 2012-10-07 MED ORDER — METHOCARBAMOL 100 MG/ML IJ SOLN
500.0000 mg | Freq: Four times a day (QID) | INTRAVENOUS | Status: DC | PRN
  Administered 2012-10-07: 500 mg via INTRAVENOUS
  Filled 2012-10-07 (×2): qty 5

## 2012-10-07 MED ORDER — MENTHOL 3 MG MT LOZG: 1.0000 | LOZENGE | OROMUCOSAL | Status: DC | PRN

## 2012-10-07 MED ORDER — PROMETHAZINE HCL 25 MG/ML IJ SOLN: 6.2500 mg | INTRAMUSCULAR | Status: DC | PRN

## 2012-10-07 MED ORDER — HYDROCODONE-ACETAMINOPHEN 5-325 MG PO TABS
1.0000 | ORAL_TABLET | ORAL | Status: DC | PRN
  Administered 2012-10-08: 1 via ORAL
  Filled 2012-10-07: qty 1

## 2012-10-07 MED ORDER — PROPOFOL 10 MG/ML IV BOLUS
INTRAVENOUS | Status: DC | PRN
  Administered 2012-10-07: 180 mg via INTRAVENOUS

## 2012-10-07 MED ORDER — AMLODIPINE BESYLATE 5 MG PO TABS
5.0000 mg | ORAL_TABLET | Freq: Every day | ORAL | Status: DC
  Administered 2012-10-07: 5 mg via ORAL
  Filled 2012-10-07 (×2): qty 1

## 2012-10-07 MED ORDER — ACETAMINOPHEN 10 MG/ML IV SOLN
INTRAVENOUS | Status: DC | PRN
  Administered 2012-10-07: 1000 mg via INTRAVENOUS

## 2012-10-07 MED ORDER — BUPIVACAINE-EPINEPHRINE 0.5% -1:200000 IJ SOLN
INTRAMUSCULAR | Status: DC | PRN
  Administered 2012-10-07: 20 mL

## 2012-10-07 MED ORDER — SODIUM CHLORIDE 0.9 % IJ SOLN
3.0000 mL | Freq: Two times a day (BID) | INTRAMUSCULAR | Status: DC
  Administered 2012-10-07: 3 mL via INTRAVENOUS

## 2012-10-07 MED ORDER — FENTANYL CITRATE 0.05 MG/ML IJ SOLN
INTRAMUSCULAR | Status: DC | PRN
  Administered 2012-10-07: 50 ug via INTRAVENOUS
  Administered 2012-10-07: 25 ug via INTRAVENOUS
  Administered 2012-10-07: 50 ug via INTRAVENOUS
  Administered 2012-10-07: 100 ug via INTRAVENOUS
  Administered 2012-10-07: 25 ug via INTRAVENOUS

## 2012-10-07 MED ORDER — SODIUM CHLORIDE 0.9 % IJ SOLN: 3.0000 mL | INTRAMUSCULAR | Status: DC | PRN

## 2012-10-07 MED ORDER — SODIUM CHLORIDE 0.9 % IR SOLN
Status: DC | PRN
  Administered 2012-10-07: 16:00:00

## 2012-10-07 MED ORDER — DOCUSATE SODIUM 100 MG PO CAPS: 100.0000 mg | ORAL_CAPSULE | Freq: Two times a day (BID) | ORAL | Status: DC

## 2012-10-07 MED ORDER — AMLODIPINE BESY-BENAZEPRIL HCL 5-10 MG PO CAPS: 1.0000 | ORAL_CAPSULE | Freq: Every day | ORAL | Status: DC

## 2012-10-07 MED ORDER — HYDROMORPHONE HCL PF 1 MG/ML IJ SOLN
0.2500 mg | INTRAMUSCULAR | Status: DC | PRN
  Administered 2012-10-07 (×4): 0.5 mg via INTRAVENOUS

## 2012-10-07 MED ORDER — ACETAMINOPHEN 10 MG/ML IV SOLN
INTRAVENOUS | Status: AC
  Filled 2012-10-07: qty 100

## 2012-10-07 MED ORDER — PANTOPRAZOLE SODIUM 40 MG PO TBEC
40.0000 mg | DELAYED_RELEASE_TABLET | Freq: Every day | ORAL | Status: DC
  Administered 2012-10-08: 40 mg via ORAL
  Filled 2012-10-07: qty 1

## 2012-10-07 MED ORDER — PHENOL 1.4 % MT LIQD: 1.0000 | OROMUCOSAL | Status: DC | PRN

## 2012-10-07 MED ORDER — BUPIVACAINE-EPINEPHRINE 0.5% -1:200000 IJ SOLN
INTRAMUSCULAR | Status: AC
  Filled 2012-10-07: qty 1

## 2012-10-07 MED ORDER — DOCUSATE SODIUM 100 MG PO CAPS
100.0000 mg | ORAL_CAPSULE | Freq: Two times a day (BID) | ORAL | Status: DC
  Administered 2012-10-07 – 2012-10-08 (×2): 100 mg via ORAL

## 2012-10-07 MED ORDER — CYANOCOBALAMIN 500 MCG PO TABS
500.0000 ug | ORAL_TABLET | Freq: Every day | ORAL | Status: DC
  Administered 2012-10-08: 500 ug via ORAL
  Filled 2012-10-07: qty 1

## 2012-10-07 MED ORDER — OXYCODONE-ACETAMINOPHEN 7.5-325 MG PO TABS: 1.0000 | ORAL_TABLET | ORAL | Status: DC | PRN

## 2012-10-07 MED ORDER — FLUTICASONE PROPIONATE 50 MCG/ACT NA SUSP
2.0000 | Freq: Every day | NASAL | Status: DC
  Administered 2012-10-07: 2 via NASAL
  Filled 2012-10-07: qty 16

## 2012-10-07 MED ORDER — ONDANSETRON HCL 4 MG/2ML IJ SOLN
4.0000 mg | INTRAMUSCULAR | Status: DC | PRN
  Administered 2012-10-08: 4 mg via INTRAVENOUS
  Filled 2012-10-07: qty 2

## 2012-10-07 MED ORDER — SERTRALINE HCL 100 MG PO TABS
150.0000 mg | ORAL_TABLET | Freq: Every day | ORAL | Status: DC
  Administered 2012-10-07: 150 mg via ORAL
  Filled 2012-10-07 (×2): qty 1

## 2012-10-07 MED ORDER — GLYCOPYRROLATE 0.2 MG/ML IJ SOLN
INTRAMUSCULAR | Status: DC | PRN
  Administered 2012-10-07: 0.4 mg via INTRAVENOUS

## 2012-10-07 MED ORDER — SODIUM CHLORIDE 0.9 % IV SOLN: 250.0000 mL | INTRAVENOUS | Status: DC

## 2012-10-07 MED ORDER — FENTANYL CITRATE 0.05 MG/ML IJ SOLN
INTRAMUSCULAR | Status: AC
  Filled 2012-10-07: qty 2

## 2012-10-07 MED ORDER — ALBUTEROL SULFATE HFA 108 (90 BASE) MCG/ACT IN AERS: 2.0000 | INHALATION_SPRAY | Freq: Four times a day (QID) | RESPIRATORY_TRACT | Status: DC | PRN

## 2012-10-07 MED ORDER — OXYCODONE-ACETAMINOPHEN 5-325 MG PO TABS: 1.0000 | ORAL_TABLET | ORAL | Status: DC | PRN

## 2012-10-07 MED ORDER — CEFAZOLIN SODIUM-DEXTROSE 2-3 GM-% IV SOLR
INTRAVENOUS | Status: AC
  Filled 2012-10-07: qty 50

## 2012-10-07 MED ORDER — THROMBIN 5000 UNITS EX SOLR
CUTANEOUS | Status: DC | PRN
  Administered 2012-10-07: 10000 [IU] via TOPICAL

## 2012-10-07 MED ORDER — AMLODIPINE BESYLATE 5 MG PO TABS
5.0000 mg | ORAL_TABLET | Freq: Once | ORAL | Status: DC
  Filled 2012-10-07: qty 1

## 2012-10-07 MED ORDER — BENAZEPRIL HCL 10 MG PO TABS
10.0000 mg | ORAL_TABLET | Freq: Every day | ORAL | Status: DC
  Administered 2012-10-07: 10 mg via ORAL
  Filled 2012-10-07 (×2): qty 1

## 2012-10-07 MED ORDER — HYDROMORPHONE HCL PF 1 MG/ML IJ SOLN
0.5000 mg | INTRAMUSCULAR | Status: DC | PRN
Start: 2012-10-07 — End: 2012-10-08
  Administered 2012-10-08: 1 mg via INTRAVENOUS
  Filled 2012-10-07: qty 1

## 2012-10-07 MED ORDER — CEFAZOLIN SODIUM-DEXTROSE 2-3 GM-% IV SOLR
2.0000 g | Freq: Three times a day (TID) | INTRAVENOUS | Status: AC
  Administered 2012-10-07 – 2012-10-08 (×2): 2 g via INTRAVENOUS
  Filled 2012-10-07 (×2): qty 50

## 2012-10-07 MED ORDER — HYDROMORPHONE HCL PF 1 MG/ML IJ SOLN
INTRAMUSCULAR | Status: AC
  Filled 2012-10-07: qty 1

## 2012-10-07 MED ORDER — METHOCARBAMOL 500 MG PO TABS
500.0000 mg | ORAL_TABLET | Freq: Four times a day (QID) | ORAL | Status: DC | PRN
  Administered 2012-10-08: 500 mg via ORAL
  Filled 2012-10-07: qty 1

## 2012-10-07 MED ORDER — LORATADINE 10 MG PO TABS
10.0000 mg | ORAL_TABLET | Freq: Every day | ORAL | Status: DC
  Administered 2012-10-08: 10 mg via ORAL
  Filled 2012-10-07: qty 1

## 2012-10-07 MED ORDER — SODIUM CHLORIDE 0.45 % IV SOLN
INTRAVENOUS | Status: DC
  Administered 2012-10-07: 20:00:00 via INTRAVENOUS

## 2012-10-07 MED ORDER — CISATRACURIUM BESYLATE (PF) 10 MG/5ML IV SOLN
INTRAVENOUS | Status: DC | PRN
  Administered 2012-10-07: 10 mg via INTRAVENOUS

## 2012-10-07 MED ORDER — CEFAZOLIN SODIUM-DEXTROSE 2-3 GM-% IV SOLR
2.0000 g | INTRAVENOUS | Status: AC
  Administered 2012-10-07: 2 g via INTRAVENOUS

## 2012-10-07 MED ORDER — MIDAZOLAM HCL 5 MG/5ML IJ SOLN
INTRAMUSCULAR | Status: DC | PRN
  Administered 2012-10-07: 2 mg via INTRAVENOUS

## 2012-10-07 MED ORDER — THROMBIN 5000 UNITS EX SOLR
CUTANEOUS | Status: AC
  Filled 2012-10-07: qty 10000

## 2012-10-07 MED ORDER — METHOCARBAMOL 500 MG PO TABS: 500.0000 mg | ORAL_TABLET | Freq: Three times a day (TID) | ORAL | Status: DC

## 2012-10-07 MED ORDER — FENTANYL CITRATE 0.05 MG/ML IJ SOLN
25.0000 ug | INTRAMUSCULAR | Status: DC | PRN
  Administered 2012-10-07 (×2): 25 ug via INTRAVENOUS

## 2012-10-07 MED ORDER — DEXAMETHASONE SODIUM PHOSPHATE 10 MG/ML IJ SOLN
INTRAMUSCULAR | Status: DC | PRN
  Administered 2012-10-07: 10 mg via INTRAVENOUS

## 2012-10-07 MED ORDER — ONDANSETRON HCL 4 MG/2ML IJ SOLN
INTRAMUSCULAR | Status: DC | PRN
  Administered 2012-10-07: 4 mg via INTRAVENOUS

## 2012-10-07 MED ORDER — LACTATED RINGERS IV SOLN
INTRAVENOUS | Status: DC
  Administered 2012-10-07 (×2): via INTRAVENOUS

## 2012-10-07 MED ORDER — ACETAMINOPHEN 650 MG RE SUPP: 650.0000 mg | RECTAL | Status: DC | PRN

## 2012-10-07 MED ORDER — LACTATED RINGERS IV SOLN: INTRAVENOUS | Status: DC | PRN

## 2012-10-07 MED ORDER — LEVOTHYROXINE SODIUM 50 MCG PO TABS
50.0000 ug | ORAL_TABLET | Freq: Every day | ORAL | Status: DC
  Administered 2012-10-08: 50 ug via ORAL
  Filled 2012-10-07 (×2): qty 1

## 2012-10-07 MED ORDER — NEOSTIGMINE METHYLSULFATE 1 MG/ML IJ SOLN
INTRAMUSCULAR | Status: DC | PRN
  Administered 2012-10-07: 3 mg via INTRAVENOUS

## 2012-10-07 MED ORDER — ACETAMINOPHEN 325 MG PO TABS: 650.0000 mg | ORAL_TABLET | ORAL | Status: DC | PRN

## 2012-10-07 SURGICAL SUPPLY — 49 items
BAG ZIPLOCK 12X15 (MISCELLANEOUS) ×2 IMPLANT
BENZOIN TINCTURE PRP APPL 2/3 (GAUZE/BANDAGES/DRESSINGS) ×2 IMPLANT
CHLORAPREP W/TINT 26ML (MISCELLANEOUS) IMPLANT
CLEANER TIP ELECTROSURG 2X2 (MISCELLANEOUS) ×2 IMPLANT
CLOTH 2% CHLOROHEXIDINE 3PK (PERSONAL CARE ITEMS) ×2 IMPLANT
CLOTH BEACON ORANGE TIMEOUT ST (SAFETY) ×2 IMPLANT
DECANTER SPIKE VIAL GLASS SM (MISCELLANEOUS) ×2 IMPLANT
DRAPE MICROSCOPE LEICA (MISCELLANEOUS) ×4 IMPLANT
DRAPE POUCH INSTRU U-SHP 10X18 (DRAPES) ×2 IMPLANT
DRAPE SURG 17X11 SM STRL (DRAPES) ×2 IMPLANT
DRSG AQUACEL AG ADV 3.5X 4 (GAUZE/BANDAGES/DRESSINGS) ×4 IMPLANT
DRSG EMULSION OIL 3X3 NADH (GAUZE/BANDAGES/DRESSINGS) ×2 IMPLANT
DRSG PAD ABDOMINAL 8X10 ST (GAUZE/BANDAGES/DRESSINGS) IMPLANT
DRSG TELFA 4X5 ISLAND ADH (GAUZE/BANDAGES/DRESSINGS) IMPLANT
DURAPREP 26ML APPLICATOR (WOUND CARE) ×2 IMPLANT
ELECT REM PT RETURN 9FT ADLT (ELECTROSURGICAL) ×2
ELECTRODE REM PT RTRN 9FT ADLT (ELECTROSURGICAL) ×1 IMPLANT
GLOVE BIOGEL PI IND STRL 7.5 (GLOVE) ×1 IMPLANT
GLOVE BIOGEL PI IND STRL 8 (GLOVE) ×1 IMPLANT
GLOVE BIOGEL PI INDICATOR 7.5 (GLOVE) ×1
GLOVE BIOGEL PI INDICATOR 8 (GLOVE) ×1
GLOVE SURG SS PI 7.5 STRL IVOR (GLOVE) ×2 IMPLANT
GLOVE SURG SS PI 8.0 STRL IVOR (GLOVE) ×4 IMPLANT
GOWN PREVENTION PLUS LG XLONG (DISPOSABLE) IMPLANT
GOWN STRL REIN XL XLG (GOWN DISPOSABLE) ×8 IMPLANT
KIT BASIN OR (CUSTOM PROCEDURE TRAY) ×2 IMPLANT
KIT POSITIONING SURG ANDREWS (MISCELLANEOUS) ×2 IMPLANT
MANIFOLD NEPTUNE II (INSTRUMENTS) IMPLANT
NEEDLE SPNL 18GX3.5 QUINCKE PK (NEEDLE) ×6 IMPLANT
PATTIES SURGICAL .5 X.5 (GAUZE/BANDAGES/DRESSINGS) IMPLANT
PATTIES SURGICAL .75X.75 (GAUZE/BANDAGES/DRESSINGS) IMPLANT
PATTIES SURGICAL 1X1 (DISPOSABLE) IMPLANT
SPONGE SURGIFOAM ABS GEL 100 (HEMOSTASIS) ×2 IMPLANT
STAPLER VISISTAT (STAPLE) IMPLANT
STRIP CLOSURE SKIN 1/2X4 (GAUZE/BANDAGES/DRESSINGS) IMPLANT
SUT PROLENE 3 0 PS 2 (SUTURE) IMPLANT
SUT VIC AB 0 CT1 27 (SUTURE)
SUT VIC AB 0 CT1 27XBRD ANTBC (SUTURE) IMPLANT
SUT VIC AB 1 CT1 27 (SUTURE) ×1
SUT VIC AB 1 CT1 27XBRD ANTBC (SUTURE) ×1 IMPLANT
SUT VIC AB 1-0 CT2 27 (SUTURE) IMPLANT
SUT VIC AB 2-0 CT1 27 (SUTURE) ×1
SUT VIC AB 2-0 CT1 TAPERPNT 27 (SUTURE) ×1 IMPLANT
SUT VIC AB 2-0 CT2 27 (SUTURE) IMPLANT
SUT VICRYL 0 27 CT2 27 ABS (SUTURE) ×4 IMPLANT
SUT VICRYL 0 UR6 27IN ABS (SUTURE) IMPLANT
SYRINGE 10CC LL (SYRINGE) ×2 IMPLANT
TRAY LAMINECTOMY (CUSTOM PROCEDURE TRAY) ×2 IMPLANT
YANKAUER SUCT BULB TIP NO VENT (SUCTIONS) ×2 IMPLANT

## 2012-10-07 NOTE — Transfer of Care (Signed)
Immediate Anesthesia Transfer of Care Note  Patient: Mathew Bernard  Procedure(s) Performed: Procedure(s): MICRODISCECTOMY L4-5 (N/A)  Patient Location: PACU  Anesthesia Type:General  Level of Consciousness: awake, alert  and oriented  Airway & Oxygen Therapy: Patient Spontanous Breathing and Patient connected to face mask oxygen  Post-op Assessment: Report given to PACU RN and Post -op Vital signs reviewed and stable  Post vital signs: Reviewed and stable  Complications: No apparent anesthesia complications

## 2012-10-07 NOTE — H&P (View-Only) (Signed)
Mathew Bernard is an 62 y.o. male.   Chief Complaint: back and right leg pain HPI: The patient reports low back pain which began 6 weeks ago following a specific injury (08/22/12). The injury occurred at work due to twisting, lifting and bending. The pain radiates to the right posterior thigh. The patient describes the pain as sharp and severe. The patient feels as if the symptoms are worsening. Symptoms are exacerbated by bending. Current treatment includes ice packs and TENS unit. He was seen by a doctor in [place] at [facility] and recieved two steroid injection of to the lumbar spine. Symptoms refractory to Percocet, activity modification, ESI x 2, relative rest  Past Medical History  Diagnosis Date  . Hypertension   . High cholesterol   . Thyroid disease     Past Surgical History  Procedure Laterality Date  . Back surgery      No family history on file. Social History:  reports that he has been smoking.  He does not have any smokeless tobacco history on file. He reports that he does not drink alcohol. His drug history is not on file.  Allergies:  Allergies  Allergen Reactions  . Codeine     Jittery      (Not in a hospital admission)  No results found for this or any previous visit (from the past 48 hour(s)). No results found.  Review of Systems  Constitutional: Negative.   Eyes: Negative.   Respiratory: Negative.   Cardiovascular: Negative.   Gastrointestinal: Negative.   Genitourinary: Negative.   Musculoskeletal: Positive for back pain.  Skin: Negative.   Neurological: Positive for sensory change and focal weakness.  Psychiatric/Behavioral: Negative.     There were no vitals taken for this visit. Physical Exam  Constitutional: He is oriented to person, place, and time. He appears well-developed and well-nourished. He appears distressed.  HENT:  Head: Normocephalic and atraumatic.  Eyes: Conjunctivae and EOM are normal. Pupils are equal, round, and reactive to  light.  Neck: Normal range of motion. Neck supple.  Cardiovascular: Normal rate and regular rhythm.   Respiratory: Effort normal and breath sounds normal.  GI: Soft. Bowel sounds are normal.  Musculoskeletal:  The patient is in moderate distress. Mood and affect are appropriate. Straight leg raise on the right produces buttock, thigh, and calf pain, exacerbated with dorsal argumentation maneuver, negative on the left. EHL is 4+/5. Decreased sensation L5 dermatome.  Lumbar spine exam reveals no evidence of soft tissue swelling, no evidence of soft tissue swelling or deformity or skin ecchymosis. On palpation there is no tenderness of the lumbar spine. No flank pain with percussion. The abdomen is soft and nontender. Nontender over the trochanters. No cellulitis or lymphadenopathy.   Good range of motion of the lumbar spine without associated pain. Motor is 5/5 including tibialis anterior, plantarflexion, quadriceps and hamstrings. The patient is normoreflexic. There is no Babinski or clonus. The patient has good distal pulses. No DVT. No pain and normal range of motion without instability of the hips, knees and ankles.  Neurological: He is alert and oriented to person, place, and time. He has normal reflexes.  Skin: Skin is warm and dry.  Psychiatric: He has a normal mood and affect.     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  After an extensive discussion concerning pathology, relevant anatomy, and treatment options at this point in the presence of neurological deficit failing  conservative treatment in the presence of a neural compressive lesion, we discussed lumbar decompression.  We discussed two weeks postoperatively suture removal, physical therapy within two weeks up until six weeks two to three times a week. At the six week mark then engage in a work conditioning  program to a twelve week postoperative mark at which point he will be eligible for maximum medical improvement. I do feel this is related to his initial injury given his twisting and lifting.  I had an extensive discussion of the risks and benefits of lumbar decompression with the patient including bleeding, infection, damage to neurovascular structures, epidural fibrosis, CSF leakage requiring repair. We also discussed increase in pain, adjacent segment disease, recurrent disc herniation, need for future surgery including repeat decompression and/or fusion. We also discussed risks of postoperative hematoma, paralysis, anesthetic complications including DVT, PE, death, cardiopulmonary dysfunction. In addition, the perioperative and postoperative courses were discussed in detail including the rehabilitative time and return to functional activity and work. I provided the patient with an illustrated handout and utilized the appropriate surgical models.  Plan microlumbar decompression L4-5  BISSELL, JACLYN M. for Dr. Shelle Iron 10/03/2012, 9:28 AM

## 2012-10-07 NOTE — Brief Op Note (Signed)
10/07/2012  4:38 PM  PATIENT:  Mathew Bernard  62 y.o. male  PRE-OPERATIVE DIAGNOSIS:  HERNIATED NUCLEUS PULPOSA L4-5  POST-OPERATIVE DIAGNOSIS:  HERNIATED NUCLEUS PULPOSA L4-5  PROCEDURE:  Procedure(s): MICRODISCECTOMY L4-5 (N/A)  SURGEON:  Surgeon(s) and Role:    * Javier Docker, MD - Primary  PHYSICIAN ASSISTANT:   ASSISTANTS: Dawayne Cirri ANESTHESIA:   general  EBL:  Total I/O In: 1000 [I.V.:1000] Out: -   BLOOD ADMINISTERED:none  DRAINS: none   LOCAL MEDICATIONS USED:  MARCAINE     SPECIMEN:  Source of Specimen:  L45  DISPOSITION OF SPECIMEN:  PATHOLOGY  COUNTS:  YES  TOURNIQUET:  * No tourniquets in log *  DICTATION: .Other Dictation: Dictation Number [id number]  PLAN OF CARE: Admit for overnight observation  PATIENT DISPOSITION:  PACU - hemodynamically stable.   Delay start of Pharmacological VTE agent (>24hrs) due to surgical blood loss or risk of bleeding: yes

## 2012-10-07 NOTE — Anesthesia Postprocedure Evaluation (Signed)
  Anesthesia Post-op Note  Patient: Mathew Bernard  Procedure(s) Performed: Procedure(s) (LRB): MICRODISCECTOMY L4-5 (N/A)  Patient Location: PACU  Anesthesia Type: General  Level of Consciousness: awake and alert   Airway and Oxygen Therapy: Patient Spontanous Breathing  Post-op Pain: mild  Post-op Assessment: Post-op Vital signs reviewed, Patient's Cardiovascular Status Stable, Respiratory Function Stable, Patent Airway and No signs of Nausea or vomiting  Last Vitals:  Filed Vitals:   10/07/12 2018  BP: 145/73  Pulse: 80  Temp: 36.9 C  Resp: 18    Post-op Vital Signs: stable   Complications: No apparent anesthesia complications

## 2012-10-07 NOTE — Anesthesia Preprocedure Evaluation (Addendum)
Anesthesia Evaluation  Patient identified by MRN, date of birth, ID band Patient awake  General Assessment Comment:States that he had depression after his neck surgery 2 years ago.  Reviewed: Allergy & Precautions, H&P , NPO status , Patient's Chart, lab work & pertinent test results  Airway Mallampati: II TM Distance: >3 FB Neck ROM: Full    Dental no notable dental hx.    Pulmonary sleep apnea and Continuous Positive Airway Pressure Ventilation , pneumonia -, resolved, Current Smoker,  breath sounds clear to auscultation  Pulmonary exam normal       Cardiovascular Exercise Tolerance: Good hypertension, Pt. on medications negative cardio ROS  Rhythm:Regular Rate:Normal  ECG: SB 54   Neuro/Psych PSYCHIATRIC DISORDERS Anxiety Depression negative neurological ROS     GI/Hepatic Neg liver ROS, GERD-  Medicated,  Endo/Other  Hypothyroidism   Renal/GU negative Renal ROS  negative genitourinary   Musculoskeletal negative musculoskeletal ROS (+)   Abdominal (+) + obese,   Peds negative pediatric ROS (+)  Hematology negative hematology ROS (+)   Anesthesia Other Findings   Reproductive/Obstetrics negative OB ROS                         Anesthesia Physical Anesthesia Plan  ASA: II  Anesthesia Plan: General   Post-op Pain Management:    Induction: Intravenous  Airway Management Planned: Oral ETT  Additional Equipment:   Intra-op Plan:   Post-operative Plan: Extubation in OR  Informed Consent: I have reviewed the patients History and Physical, chart, labs and discussed the procedure including the risks, benefits and alternatives for the proposed anesthesia with the patient or authorized representative who has indicated his/her understanding and acceptance.   Dental advisory given  Plan Discussed with: CRNA  Anesthesia Plan Comments:         Anesthesia Quick Evaluation

## 2012-10-07 NOTE — Interval H&P Note (Signed)
History and Physical Interval Note:  10/07/2012 1:12 PM  Mathew Bernard  has presented today for surgery, with the diagnosis of H&P L4-5  The various methods of treatment have been discussed with the patient and family. After consideration of risks, benefits and other options for treatment, the patient has consented to  Procedure(s): MICRODISCECTOMY L4-5 (N/A) as a surgical intervention .  The patient's history has been reviewed, patient examined, no change in status, stable for surgery.  I have reviewed the patient's chart and labs.  Questions were answered to the patient's satisfaction.     [name] C

## 2012-10-08 LAB — BASIC METABOLIC PANEL
BUN: 18 mg/dL (ref 6–23)
CO2: 28 mEq/L (ref 19–32)
Calcium: 9.2 mg/dL (ref 8.4–10.5)
Chloride: 100 mEq/L (ref 96–112)
Creatinine, Ser: 1.03 mg/dL (ref 0.50–1.35)
GFR calc Af Amer: 88 mL/min — ABNORMAL LOW (ref >90)
GFR calc non Af Amer: 76 mL/min — ABNORMAL LOW (ref >90)
Glucose, Bld: 173 mg/dL — ABNORMAL HIGH (ref 70–99)
Potassium: 4.3 mEq/L (ref 3.5–5.1)
Sodium: 136 mEq/L (ref 135–145)

## 2012-10-08 NOTE — Progress Notes (Signed)
CSW consult received.  Chart review.  Noted that PT and OT stated that no follow-up is needed.  CSW to sign off.  Providence Crosby, LCSWA Clinical Social Work [phone]

## 2012-10-08 NOTE — Evaluation (Signed)
Occupational Therapy Evaluation Patient Details Name: Mathew Bernard MRN: [id number] DOB: 17-Jan-1951 Today's Date: 10/08/2012 Time: 9528-4132 OT Time Calculation (min): 21 min  OT Assessment / Plan / Recommendation Clinical Impression  Evaluation and Education complete    OT Assessment  Patient does not need any further OT services    Follow Up Recommendations  No OT follow up                Precautions / Restrictions Precautions Precautions: Back Precaution Booklet Issued: Yes (comment) Precaution Comments: Provided handout and education.  Restrictions Weight Bearing Restrictions: No       ADL  Lower Body Dressing: Performed;Supervision/safety Where Assessed - Lower Body Dressing: Supported sit to stand Toilet Transfer: Research scientist (life sciences) Method: Sit to Barista: Comfort height toilet;Grab bars Toileting - Architect and Hygiene: Performed;Supervision/safety Where Assessed - Engineer, mining and Hygiene: Standing Tub/Shower Transfer: Engineer, manufacturing Method: Ambulating Transfers/Ambulation Related to ADLs: Educated on back precautions with ADL activity. Reccomended a reacher for pt        Visit Information  Last OT Received On: 10/08/12 Assistance Needed: +1    Subjective Data  Subjective: If I do what i am suppose to do it doesnt hurt   Prior Functioning     Home Living Lives With: Spouse Available Help at Discharge: Family;Available 24 hours/day Type of Home: House Home Access: Stairs to enter [employer] of Steps: 4 Entrance Stairs-Rails: Can reach both Home Layout: Two level Bathroom Shower/Tub: Health visitor:  (comfort height) Home Adaptive Equipment: Built-in shower seat Additional Comments: walking stick Prior Function Level of Independence: Independent Able to Take Stairs?: Yes Driving: Yes Vocation:  Full time employment Communication Communication: No difficulties         Vision/Perception Vision - History Patient Visual Report: No change from baseline   Cognition  Cognition Arousal/Alertness: Awake/alert Behavior During Therapy: WFL for tasks assessed/performed Overall Cognitive Status: Within Functional Limits for tasks assessed    Extremity/Trunk Assessment Right Upper Extremity Assessment RUE ROM/Strength/Tone: WFL for tasks assessed Left Upper Extremity Assessment LUE ROM/Strength/Tone: WFL for tasks assessed Right Lower Extremity Assessment RLE ROM/Strength/Tone: Deficits RLE ROM/Strength/Tone Deficits: Quads slightly weaker on right side, grossly 4/5 RLE Sensation: WFL - Light Touch Left Lower Extremity Assessment LLE ROM/Strength/Tone: WFL for tasks assessed LLE Sensation: WFL - Light Touch     Mobility Bed Mobility Bed Mobility: Rolling Right;Right Sidelying to Sit;Sit to Sidelying Right Rolling Right: 5: Supervision Right Sidelying to Sit: 4: Min guard Sit to Sidelying Right: 4: Min assist Details for Bed Mobility Assistance: Pt able to roll onto R side with supervision and cues for log rolling technique with min/guard assist when sitting up and assist for LEs getting back into bed.  Transfers Transfers: Sit to Stand;Stand to Sit Sit to Stand: 5: Supervision;From bed;From chair/3-in-1;From toilet;With upper extremity assist Stand to Sit: 5: Supervision;To toilet;To chair/3-in-1;With upper extremity assist Details for Transfer Assistance: Cues for maintaining back in upright alignment.            End of Session OT - End of Session Patient left: in chair;with call bell/phone within reach       [facility], [place] Kung 10/08/2012, 10:51 AM

## 2012-10-08 NOTE — Op Note (Signed)
NAMEBUCKLEY, BRADLY               ACCOUNT NO.:  [id number]  MEDICAL RECORD NO.:  [id number]  LOCATION:  1605                         FACILITY:  [facility]  PHYSICIAN:  Jene Every, M.D.    DATE OF BIRTH:  [birth year]  DATE OF PROCEDURE:  10/07/2012 DATE OF DISCHARGE:                              OPERATIVE REPORT   PREOPERATIVE DIAGNOSIS:  Spinal stenosis, herniated nucleus pulposus, L4- 5, right.  POSTOPERATIVE DIAGNOSIS:  Spinal stenosis, herniated nucleus pulposus, L4-5, right.  PROCEDURE PERFORMED: 1. Microlumbar decompression L4-5 right. 2. Foraminotomies at L4-L5, right. 3. Retrieval of loose fragment 4-5.  ANESTHESIA:  General.  ASSISTANT:  Lanna Poche, PA.  BLOOD LOSS:  Minimal.  BRIEF HISTORY:  61, L4 radicular pain, myotome weakness, dermatomal distribution secondary to foraminal disk herniation, migrating cephalad from the disk space at 4-5 out into the foramen, severe pain, compressing the root, indicated for decompression.  Risks and benefits discussed including bleeding, infection, damage to neurovascular structures, DVT, PE, anesthetic complications, etc.  TECHNIQUE:  With the patient in supine position, after induction of adequate general anesthesia, 2 g Kefzol, placed prone on the [place] frame.  All bony prominences were well padded.  Lumbar region was prepped and draped in the usual sterile fashion.  Two 18-gauge spinal needles were utilized to localize 4-5 interspace and confirmed with x- ray.  Incision was made from spinous process of 4-5.  Subcutaneous tissue was dissected.  Electrocautery was utilized to achieve hemostasis.  Dorsolumbar fascia identified and divided in line with skin incision.  Paraspinous muscle elevated from lamina of 4 and 5. McCullough retractor was placed.  Operating microscope was draped and brought into the surgical field.  Confirmatory radiograph obtained. Hemilaminotomy of the caudad edge of 4 was performed with a 3  mm Kerrison.  I detached ligamentum flavum.  Pars was preserved. Ligamentum flavum detached from the cephalad edge of 5 utilizing a straight curette.  Hypertrophic facet was noted.  Osteotome was utilized to perform partial medial hemifacetectomy, 25% of the medial half.  I performed foraminotomy of 5.  Ligamentum flavum removed from the interspace and decompressed lateral recess to the medial border of the pedicle.  Identified the 5 root, thecal sac, and performing the laminotomy, we identified the inferior aspect of the L4 nerve root and the disk space.  We used combination of a Woodson retractor and nerve hook to gently mobilize 3 separate fragments that were beneath the nerve root and to the pedicle of 4 consistent with that seen on the MRI. These were retrieved and sent to Pathology.  These were compressing the 4 root and after visual exploration there was no further disk herniation noted.  We checked beneath the thecal sac, the axilla of the root, shoulder of the root on top and back of the root, and beneath thecal sac.  No residual disk herniation.  Bipolar electrocautery was utilized to achieve hemostasis with thrombin-soaked Gelfoam.  Following this, confirmatory radiographs obtained.  Copious irrigation was used.  I removed the McCullough retractor.  Inspection revealed no evidence of CSF leaks or active bleeding.  I closed the fascia with 1 Vicryl, subcu 2-0, skin 4-0 subcuticular Prolene.  Wound reinforced with  Steri-Strips. Sterile dressing applied.  Placed supine on hospital bed, extubated without difficulty, and transported to the recovery room in satisfactory condition.  The patient tolerated the procedure well.  No complications.  Minimal blood loss.     Jene Every, M.D.     Cordelia Pen  D:  10/07/2012  T:  10/08/2012  Job:  [id number]

## 2012-10-08 NOTE — Progress Notes (Signed)
md hewitt called and informed of restrictions on using the eco2 detector with cpap and that cpap may help pt with his insomnia. md stated to d/c eco2 dector for now and place pt on cpap. RT called and informed. Will continue to monitor.

## 2012-10-08 NOTE — Progress Notes (Addendum)
Discharged from floor via w/c, spouse with pt. No changes in assessment. Mathew Bernard   

## 2012-10-08 NOTE — Progress Notes (Signed)
Pt was placed on CPAP using [facility] CPAP machine and pt home nasal mask and tubing. Pt was placed on home settings of 11 cmH2O. Pt is comfortable at this time. Vitals are stable. RT told pt that if he had any questions or problems to please call we will be here all night.

## 2012-10-08 NOTE — Discharge Summary (Signed)
PATIENT ID:      Mathew Bernard  MRN:     [id number] DOB/AGE:    1951-01-28 / 62 y.o.     DISCHARGE SUMMARY  ADMISSION DATE:    10/07/2012 DISCHARGE DATE:   10/08/2012   ADMISSION DIAGNOSIS: H P L4-5  (HERNIATED NUCLEUS PULPOSA L4-5)  DISCHARGE DIAGNOSIS:  HERNIATED NUCLEUS PULPOSA L4-5    ADDITIONAL DIAGNOSIS: Principal Problem:   HNP (herniated nucleus pulposus), lumbar  Past Medical History  Diagnosis Date  . Hypertension   . High cholesterol   . Thyroid disease   . Complication of anesthesia      "depressed after surgery"  . Hypothyroidism   . Depression   . Anxiety   . Sleep apnea     CPAP  . Pneumonia     hx of in 70's  . History of kidney stones   . GERD (gastroesophageal reflux disease)   . Arthritis     PROCEDURE: Procedure(s): MICRODISCECTOMY L4-5 on 10/07/2012  CONSULTS:   none  HISTORY:  See H&P in chart  HOSPITAL COURSE:  Mathew Bernard is a 62 y.o. admitted on 10/07/2012 and found to have a diagnosis of HERNIATED NUCLEUS PULPOSA L4-5.  After appropriate laboratory studies were obtained  they were taken to the operating room on 10/07/2012 and underwent Procedure(s): MICRODISCECTOMY L4-5.   They were given perioperative antibiotics:  Anti-infectives   Start     Dose/Rate Route Frequency Ordered Stop   10/07/12 2200  ceFAZolin (ANCEF) IVPB 2 g/50 mL premix     2 g 100 mL/hr over 30 Minutes Intravenous Every 8 hours 10/07/12 1833 10/08/12 0621   10/07/12 1553  polymyxin B 500,000 Units, bacitracin 50,000 Units in sodium chloride irrigation 0.9 % 500 mL irrigation  Status:  Discontinued       As needed 10/07/12 1553 10/07/12 1652   10/07/12 1015  ceFAZolin (ANCEF) IVPB 2 g/50 mL premix     2 g 100 mL/hr over 30 Minutes Intravenous On call to O.R. 10/07/12 1004 10/07/12 1500    . Blood products given:none  The first day post op pt had excellent relief of his right leg pain from pre op. His back pain was minimal The remainder of the hospital course was  dedicated to ambulation and strengthening.   The patient was discharged on 1 Day Post-Op in  Good condition.

## 2012-10-08 NOTE — Progress Notes (Signed)
Pt with insomnia. Called md on call awaiting call back.

## 2012-10-08 NOTE — Evaluation (Signed)
Physical Therapy Evaluation Patient Details Name: Mathew Bernard MRN: [id number] DOB: 10-19-1950 Today's Date: 10/08/2012 Time: 4098-1191 PT Time Calculation (min): 32 min  PT Assessment / Plan / Recommendation Clinical Impression  Pt presents s/p L4/5 decompression microdiscectomy POD 1 with decreased strength in RLE and mobility.  Tolerated in and OOB well and ambulation in hallway without AD at supervision level.  Pt will not require any further follow up in hospital or at home.  Did recommend he ask Dr. Shelle Iron about outpatient therapy at follow up visit to begin strengthening his core.      PT Assessment  Patent does not need any further PT services    Follow Up Recommendations  No PT follow up    Does the patient have the potential to tolerate intense rehabilitation      Barriers to Discharge        Equipment Recommendations  None recommended by PT    Recommendations for Other Services     Frequency      Precautions / Restrictions Precautions Precautions: Back Precaution Booklet Issued: Yes (comment) Precaution Comments: Provided handout and education.  Restrictions Weight Bearing Restrictions: No   Pertinent Vitals/Pain 4/10 pain      Mobility  Bed Mobility Bed Mobility: Rolling Right;Right Sidelying to Sit;Sit to Sidelying Right Rolling Right: 5: Supervision Right Sidelying to Sit: 4: Min guard Sit to Sidelying Right: 4: Min assist Details for Bed Mobility Assistance: Pt able to roll onto R side with supervision and cues for log rolling technique with min/guard assist when sitting up and assist for LEs getting back into bed.  Transfers Transfers: Sit to Stand;Stand to Sit Sit to Stand: 5: Supervision;From bed Stand to Sit: 5: Supervision;To chair/3-in-1 Details for Transfer Assistance: Cues for maintaining back in upright alignment.  Ambulation/Gait Ambulation/Gait Assistance: 4: Min guard;5: Supervision Ambulation Distance (Feet): 200 Feet Assistive  device: None Ambulation/Gait Assistance Details: Min/guard initially, however pt able to ambulate at supervision level the more he ambulated.   Gait Pattern: Step-through pattern;Decreased stride length Gait velocity: decreased Stairs: Yes Stairs Assistance: 5: Supervision Stairs Assistance Details (indicate cue type and reason): Cues for technique if having pain in R leg.  Stair Management Technique: One rail Left Number of Stairs: 6 Wheelchair Mobility Wheelchair Mobility: No    Exercises     PT Diagnosis:    PT Problem List:   PT Treatment Interventions:     PT Goals    Visit Information  Last PT Received On: 10/08/12 Assistance Needed: +1    Subjective Data  Subjective: It just hurts when I move a certain way Patient Stated Goal: to return home today.    Prior Functioning  Home Living Lives With: Spouse Available Help at Discharge: Family;Available 24 hours/day Type of Home: House Home Access: Stairs to enter [employer] of Steps: 4 Entrance Stairs-Rails: Can reach both Home Layout: Two level Bathroom Shower/Tub: Health visitor:  (comfort height) Home Adaptive Equipment: Built-in shower seat Additional Comments: walking stick Prior Function Level of Independence: Independent Able to Take Stairs?: Yes Driving: Yes Vocation: Full time employment Communication Communication: No difficulties    Cognition  Cognition Arousal/Alertness: Awake/alert Behavior During Therapy: WFL for tasks assessed/performed Overall Cognitive Status: Within Functional Limits for tasks assessed    Extremity/Trunk Assessment Right Lower Extremity Assessment RLE ROM/Strength/Tone: Deficits RLE ROM/Strength/Tone Deficits: Quads slightly weaker on right side, grossly 4/5 RLE Sensation: WFL - Light Touch Left Lower Extremity Assessment LLE ROM/Strength/Tone: WFL for tasks assessed LLE  Sensation: WFL - Light Touch   Balance    End of Session PT - End of  Session Activity Tolerance: Patient tolerated treatment well Patient left: in chair;with call bell/phone within reach;with family/visitor present Nurse Communication: Mobility status  GP Functional Assessment Tool Used: Clinical judgement Functional Limitation: Mobility: Walking and moving around Mobility: Walking and Moving Around Current Status (W0981): At least 1 percent but less than 20 percent impaired, limited or restricted Mobility: Walking and Moving Around Goal Status [phone]): At least 1 percent but less than 20 percent impaired, limited or restricted Mobility: Walking and Moving Around Discharge Status [phone]): At least 1 percent but less than 20 percent impaired, limited or restricted   Vista Deck 10/08/2012, 8:48 AM

## 2012-10-10 ENCOUNTER — Encounter (HOSPITAL_COMMUNITY): Payer: Self-pay | Admitting: Specialist

## 2012-11-14 ENCOUNTER — Inpatient Hospital Stay (HOSPITAL_COMMUNITY)
Admission: EM | Admit: 2012-11-14 | Discharge: 2012-11-16 | DRG: 014 | Disposition: A | Discharge status: Home / Self Care | Payer: BC Managed Care – PPO | Attending: Neurology | Admitting: Neurology

## 2012-11-14 ENCOUNTER — Emergency Department (HOSPITAL_COMMUNITY): Payer: BC Managed Care – PPO

## 2012-11-14 ENCOUNTER — Encounter (HOSPITAL_COMMUNITY): Payer: Self-pay | Admitting: Family Medicine

## 2012-11-14 ENCOUNTER — Inpatient Hospital Stay (HOSPITAL_COMMUNITY): Payer: BC Managed Care – PPO

## 2012-11-14 DIAGNOSIS — E039 Hypothyroidism, unspecified: Secondary | ICD-10-CM | POA: Diagnosis present

## 2012-11-14 DIAGNOSIS — E785 Hyperlipidemia, unspecified: Secondary | ICD-10-CM

## 2012-11-14 DIAGNOSIS — I639 Cerebral infarction, unspecified: Secondary | ICD-10-CM

## 2012-11-14 DIAGNOSIS — M129 Arthropathy, unspecified: Secondary | ICD-10-CM | POA: Diagnosis present

## 2012-11-14 DIAGNOSIS — I10 Essential (primary) hypertension: Secondary | ICD-10-CM | POA: Diagnosis present

## 2012-11-14 DIAGNOSIS — I6350 Cerebral infarction due to unspecified occlusion or stenosis of unspecified cerebral artery: Principal | ICD-10-CM | POA: Diagnosis present

## 2012-11-14 DIAGNOSIS — E7800 Pure hypercholesterolemia, unspecified: Secondary | ICD-10-CM | POA: Diagnosis present

## 2012-11-14 DIAGNOSIS — R4789 Other speech disturbances: Secondary | ICD-10-CM | POA: Diagnosis present

## 2012-11-14 DIAGNOSIS — G8190 Hemiplegia, unspecified affecting unspecified side: Secondary | ICD-10-CM | POA: Diagnosis present

## 2012-11-14 DIAGNOSIS — F329 Major depressive disorder, single episode, unspecified: Secondary | ICD-10-CM | POA: Diagnosis present

## 2012-11-14 DIAGNOSIS — I63512 Cerebral infarction due to unspecified occlusion or stenosis of left middle cerebral artery: Secondary | ICD-10-CM

## 2012-11-14 DIAGNOSIS — Z8673 Personal history of transient ischemic attack (TIA), and cerebral infarction without residual deficits: Secondary | ICD-10-CM

## 2012-11-14 DIAGNOSIS — F17200 Nicotine dependence, unspecified, uncomplicated: Secondary | ICD-10-CM | POA: Diagnosis present

## 2012-11-14 DIAGNOSIS — F3289 Other specified depressive episodes: Secondary | ICD-10-CM | POA: Diagnosis present

## 2012-11-14 DIAGNOSIS — Z7982 Long term (current) use of aspirin: Secondary | ICD-10-CM

## 2012-11-14 DIAGNOSIS — R29898 Other symptoms and signs involving the musculoskeletal system: Secondary | ICD-10-CM | POA: Diagnosis present

## 2012-11-14 DIAGNOSIS — K219 Gastro-esophageal reflux disease without esophagitis: Secondary | ICD-10-CM | POA: Diagnosis present

## 2012-11-14 DIAGNOSIS — G4733 Obstructive sleep apnea (adult) (pediatric): Secondary | ICD-10-CM | POA: Diagnosis present

## 2012-11-14 DIAGNOSIS — F411 Generalized anxiety disorder: Secondary | ICD-10-CM | POA: Diagnosis present

## 2012-11-14 DIAGNOSIS — IMO0002 Reserved for concepts with insufficient information to code with codable children: Secondary | ICD-10-CM

## 2012-11-14 DIAGNOSIS — R471 Dysarthria and anarthria: Secondary | ICD-10-CM | POA: Diagnosis present

## 2012-11-14 HISTORY — DX: Cerebral infarction, unspecified: I63.9

## 2012-11-14 LAB — COMPREHENSIVE METABOLIC PANEL
ALT: 28 U/L (ref 0–53)
AST: 22 U/L (ref 0–37)
Alkaline Phosphatase: 87 U/L (ref 39–117)
CO2: 26 mEq/L (ref 19–32)
Chloride: 104 mEq/L (ref 96–112)
GFR calc Af Amer: 76 mL/min — ABNORMAL LOW (ref >90)
GFR calc non Af Amer: 66 mL/min — ABNORMAL LOW (ref >90)
Glucose, Bld: 122 mg/dL — ABNORMAL HIGH (ref 70–99)
Potassium: 4.1 mEq/L (ref 3.5–5.1)
Sodium: 137 mEq/L (ref 135–145)
Total Bilirubin: 0.2 mg/dL — ABNORMAL LOW (ref 0.3–1.2)

## 2012-11-14 LAB — CBC
HCT: 40.6 % (ref 39.0–52.0)
MCV: 81.5 fL (ref 78.0–100.0)
Platelets: 155 10*3/uL (ref 150–400)
RBC: 4.98 MIL/uL (ref 4.22–5.81)
RDW: 15.7 % — ABNORMAL HIGH (ref 11.5–15.5)
WBC: 7.6 10*3/uL (ref 4.0–10.5)

## 2012-11-14 LAB — POCT I-STAT, CHEM 8
Calcium, Ion: 1.17 mmol/L (ref 1.13–1.30)
Creatinine, Ser: 1.1 mg/dL (ref 0.50–1.35)
Potassium: 4.2 mEq/L (ref 3.5–5.1)
TCO2: 24 mmol/L (ref 0–100)

## 2012-11-14 LAB — DIFFERENTIAL
Basophils Absolute: 0 10*3/uL (ref 0.0–0.1)
Eosinophils Relative: 3 % (ref 0–5)
Lymphocytes Relative: 20 % (ref 12–46)
Lymphs Abs: 1.5 10*3/uL (ref 0.7–4.0)
Neutro Abs: 5.2 10*3/uL (ref 1.7–7.7)

## 2012-11-14 LAB — URINALYSIS, ROUTINE W REFLEX MICROSCOPIC
Bilirubin Urine: NEGATIVE
Leukocytes, UA: NEGATIVE
Nitrite: NEGATIVE
Specific Gravity, Urine: 1.013 (ref 1.005–1.030)
Urobilinogen, UA: 0.2 mg/dL (ref 0.0–1.0)
pH: 6 (ref 5.0–8.0)

## 2012-11-14 LAB — APTT: aPTT: 35 seconds (ref 24–37)

## 2012-11-14 LAB — PROTIME-INR
INR: 0.91 (ref 0.00–1.49)
Prothrombin Time: 12.1 seconds (ref 11.6–15.2)

## 2012-11-14 LAB — RAPID URINE DRUG SCREEN, HOSP PERFORMED
Barbiturates: NOT DETECTED
Cocaine: NOT DETECTED
Tetrahydrocannabinol: NOT DETECTED

## 2012-11-14 LAB — POCT I-STAT TROPONIN I: Troponin i, poc: 0 ng/mL (ref 0.00–0.08)

## 2012-11-14 LAB — MRSA PCR SCREENING: MRSA by PCR: NEGATIVE

## 2012-11-14 LAB — ETHANOL: Alcohol, Ethyl (B): <11 mg/dL (ref 0–11)

## 2012-11-14 MED ORDER — METHYLPREDNISOLONE SODIUM SUCC 125 MG IJ SOLR: 125.0000 mg | Freq: Once | INTRAMUSCULAR | Status: AC

## 2012-11-14 MED ORDER — LABETALOL HCL 5 MG/ML IV SOLN: 10.0000 mg | INTRAVENOUS | Status: DC | PRN

## 2012-11-14 MED ORDER — SODIUM CHLORIDE 0.9 % IV SOLN
INTRAVENOUS | Status: DC
  Administered 2012-11-15 (×2): via INTRAVENOUS

## 2012-11-14 MED ORDER — STROKE: EARLY STAGES OF RECOVERY BOOK
Freq: Once | Status: DC
  Filled 2012-11-14: qty 1

## 2012-11-14 MED ORDER — ALTEPLASE (STROKE) FULL DOSE INFUSION
0.9000 mg/kg | Freq: Once | INTRAVENOUS | Status: AC
  Administered 2012-11-14: 86 mg via INTRAVENOUS
  Filled 2012-11-14: qty 86

## 2012-11-14 MED ORDER — DIPHENHYDRAMINE HCL 50 MG/ML IJ SOLN: 50.0000 mg | Freq: Once | INTRAMUSCULAR | Status: AC

## 2012-11-14 MED ORDER — ACETAMINOPHEN 650 MG RE SUPP: 650.0000 mg | RECTAL | Status: DC | PRN

## 2012-11-14 MED ORDER — METHYLPREDNISOLONE SODIUM SUCC 125 MG IJ SOLR
INTRAMUSCULAR | Status: AC
  Administered 2012-11-14: 125 mg via INTRAVENOUS
  Filled 2012-11-14: qty 2

## 2012-11-14 MED ORDER — SENNOSIDES-DOCUSATE SODIUM 8.6-50 MG PO TABS
1.0000 | ORAL_TABLET | Freq: Every evening | ORAL | Status: DC | PRN
  Filled 2012-11-14: qty 1

## 2012-11-14 MED ORDER — DIPHENHYDRAMINE HCL 50 MG/ML IJ SOLN
INTRAMUSCULAR | Status: AC
  Administered 2012-11-14: 50 mg via INTRAVENOUS
  Filled 2012-11-14: qty 1

## 2012-11-14 MED ORDER — PANTOPRAZOLE SODIUM 40 MG IV SOLR
40.0000 mg | Freq: Every day | INTRAVENOUS | Status: DC
  Administered 2012-11-14: 40 mg via INTRAVENOUS
  Filled 2012-11-14 (×2): qty 40

## 2012-11-14 MED ORDER — ACETAMINOPHEN 325 MG PO TABS: 650.0000 mg | ORAL_TABLET | ORAL | Status: DC | PRN

## 2012-11-14 NOTE — ED Provider Notes (Signed)
History    CSN: [id number] Arrival date & time 11/14/12  1315  First MD Initiated Contact with Patient 11/14/12 1321     Chief Complaint  Patient presents with  . Code Stroke   (Consider location/radiation/quality/duration/timing/severity/associated sxs/prior Treatment) HPI Comments: 62 year old male with a past medical history of hypertension, high cholesterol, hypothyroidism, depression and anxiety presents to the emergency department via EMS with concerns of a stroke. Patient states around 12:00 noon, an hour and half prior to arrival he began to "feel funny" and noticed that he had difficulty speaking. He then called his wife who went home 911. States he feels he has left-sided weakness and slurred speech. Denies confusion, numbness, chest pain or shortness of breath. No history of stroke.  The history is provided by the patient and the EMS personnel.   Past Medical History  Diagnosis Date  . Hypertension   . High cholesterol   . Thyroid disease   . Complication of anesthesia      "depressed after surgery"  . Hypothyroidism   . Depression   . Anxiety   . Sleep apnea     CPAP  . Pneumonia     hx of in 70's  . History of kidney stones   . GERD (gastroesophageal reflux disease)   . Arthritis    Past Surgical History  Procedure Laterality Date  . Back surgery  2013    cervical  . Nerve surgery      helped with headaches  . Kidney stone surgery    . Lumbar laminectomy/decompression microdiscectomy N/A 10/07/2012    Procedure: MICRODISCECTOMY L4-5;  Surgeon: Javier Docker, MD;  Location: [facility];  Service: Orthopedics;  Laterality: N/A;   History reviewed. No pertinent family history. History  Substance Use Topics  . Smoking status: Current Every Day Smoker -- 40 years    Types: Cigarettes  . Smokeless tobacco: Never Used  . Alcohol Use: No    Review of Systems  Respiratory: Negative for shortness of breath.   Cardiovascular: Negative for chest pain.   Neurological: Positive for facial asymmetry, speech difficulty and weakness.  Psychiatric/Behavioral: Negative for confusion.  All other systems reviewed and are negative.    Allergies  Codeine  Home Medications   Current Outpatient Rx  Name  Route  Sig  Dispense  Refill  . albuterol (PROVENTIL HFA;VENTOLIN HFA) 108 (90 BASE) MCG/ACT inhaler   Inhalation   Inhale 2 puffs into the lungs every 6 (six) hours as needed for wheezing.         Marland Kitchen amLODipine-benazepril (LOTREL) 5-10 MG per capsule   Oral   Take 1 capsule by mouth at bedtime.         . cetirizine (ZYRTEC) 10 MG tablet   Oral   Take 10 mg by mouth daily.         . cyanocobalamin 500 MCG tablet   Oral   Take 500 mcg by mouth daily.         Marland Kitchen docusate sodium (COLACE) 100 MG capsule   Oral   Take 100 mg by mouth 2 (two) times daily.         . fish oil-omega-3 fatty acids 1000 MG capsule   Oral   Take 2 g by mouth 2 (two) times daily.          . fluticasone (FLONASE) 50 MCG/ACT nasal spray   Nasal   Place 2 sprays into the nose daily.         Marland Kitchen  ibuprofen (ADVIL,MOTRIN) 200 MG tablet   Oral   Take 200-800 mg by mouth every 6 (six) hours as needed for pain, fever or headache.         . levothyroxine (SYNTHROID, LEVOTHROID) 50 MCG tablet   Oral   Take 50 mcg by mouth daily before breakfast.         . methocarbamol (ROBAXIN) 500 MG tablet   Oral   Take 1 tablet (500 mg total) by mouth 3 (three) times daily.   30 tablet   1   . Omega-3 Fatty Acids (FISH OIL) 1200 MG CAPS   Oral   Take 2,400 mg by mouth 2 (two) times daily.         Marland Kitchen omeprazole (PRILOSEC) 20 MG capsule   Oral   Take 20 mg by mouth 2 (two) times daily.         Marland Kitchen OVER THE COUNTER MEDICATION   Oral   Take 1 tablet by mouth every morning. Trim blast         . oxyCODONE-acetaminophen (PERCOCET) 7.5-325 MG per tablet   Oral   Take 1 tablet by mouth every 4 (four) hours as needed for pain.   60 tablet   0   .  oxyCODONE-acetaminophen (PERCOCET/ROXICET) 5-325 MG per tablet   Oral   Take 2 tablets by mouth every 6 (six) hours as needed for pain.   15 tablet   0   . sertraline (ZOLOFT) 100 MG tablet   Oral   Take 150 mg by mouth at bedtime.         . simvastatin (ZOCOR) 10 MG tablet   Oral   Take 5 mg by mouth at bedtime.          There were no vitals taken for this visit. Physical Exam  Nursing note and vitals reviewed. Constitutional: He is oriented to person, place, and time. He appears well-developed and well-nourished. No distress.  HENT:  Head: Normocephalic and atraumatic.  Mouth/Throat: Uvula is midline and oropharynx is clear and moist.  Eyes: Conjunctivae and EOM are normal. Pupils are equal, round, and reactive to light.  Neck: Normal range of motion. Neck supple.  Cardiovascular: Normal rate, regular rhythm, normal heart sounds and intact distal pulses.   Pulmonary/Chest: Effort normal and breath sounds normal. No respiratory distress.  Abdominal: Soft. Bowel sounds are normal. He exhibits no distension. There is no tenderness.  Musculoskeletal: Normal range of motion. He exhibits no edema.  Neurological: He is alert and oriented to person, place, and time. He displays no atrophy. No sensory deficit. He exhibits normal muscle tone. Coordination normal. GCS eye subscore is 4. GCS verbal subscore is 5. GCS motor subscore is 6.  Decreased strength LLE/LUE 4/5 compared to right 5/5. L sided facial droop. Slurred speech.  Skin: Skin is warm and dry. He is not diaphoretic.  Psychiatric: He has a normal mood and affect. His behavior is normal. Judgment and thought content normal. His speech is slurred. Cognition and memory are normal.    ED Course  Procedures (including critical care time) Labs Reviewed  CBC - Abnormal; Notable for the following:    RDW 15.7 (*)    All other components within normal limits  COMPREHENSIVE METABOLIC PANEL - Abnormal; Notable for the following:     Glucose, Bld 122 (*)    Total Bilirubin 0.2 (*)    GFR calc non Af Amer 66 (*)    GFR calc Af Amer 76 (*)  All other components within normal limits  GLUCOSE, CAPILLARY - Abnormal; Notable for the following:    Glucose-Capillary 124 (*)    All other components within normal limits  POCT I-STAT, CHEM 8 - Abnormal; Notable for the following:    Glucose, Bld 119 (*)    All other components within normal limits  ETHANOL  PROTIME-INR  APTT  DIFFERENTIAL  TROPONIN I  URINE RAPID DRUG SCREEN (HOSP PERFORMED)  URINALYSIS, ROUTINE W REFLEX MICROSCOPIC  POCT I-STAT TROPONIN I    Date: 11/14/2012  Rate: 56  Rhythm: normal sinus rhythm  QRS Axis: normal  Intervals: normal  ST/T Wave abnormalities: normal  Conduction Disutrbances:none  Narrative Interpretation: normal EKG  Old EKG Reviewed: unchanged   Ct Head Wo Contrast  11/14/2012   *RADIOLOGY REPORT*  Clinical Data: Code stroke.  Sudden onset left-sided weakness at new.  CT HEAD WITHOUT CONTRAST  Technique:  Contiguous axial images were obtained from the base of the skull through the vertex without contrast.  Comparison: 07/07/2012.  Findings: There is increased density in the left middle cerebral artery (image number 13 series 3).  This is suspicious for hyperdense MCA sign and thrombosis.  There are no secondary signs of left MCA infarction.  Old left basal ganglia lacunar infarct is present.  Gray-white differentiation along the insular ribbon appears intact.  Old right caudate infarct is unchanged.  Posterior fossa appears within normal limits.  No mass lesion, midline shift, hydrocephalus or hemorrhage.  The calvarium is intact.  Chronic right maxillary mucosal sinus disease.  IMPRESSION: High density in the left middle cerebral artery was probably present on the prior examination 07/07/2012.  This may represent partial volume averaging of a atherosclerosis or chronic hyperdense left MCA sign.  There are no secondary signs of  cerebral ischemia. No definite acute intracranial abnormality.  Old small lacunar infarcts are unchanged.  Critical Value/emergent results were called by telephone at the time of interpretation on 11/14/2012 at 1350 hours to [phone], Dr. Leroy Kennedy, who verbally acknowledged these results.   Original Report Authenticated By: Andreas Newport, M.D.   1. Stroke     MDM  Patient with acute stroke. He will be admitted by neurology Dr. Leroy Kennedy.    Trevor Mace, PA-C 11/14/12 1412

## 2012-11-14 NOTE — Code Documentation (Signed)
62yo male developed left facial droop, left sided weakness and slurred speech at 1200 today.  Patient was alone and called family member who came home and activated EMS.  Code stroke called at 1242 and patient arrived to [facility] via [facility] at 1315.  EDP exam at 1315, Stroke Team arrival at 1318, and patient arrived to CT at 1317. On arrival, NIHSS 3 for dysarthria and left facial droop with tongue deviation (see documentation).  Patient is noted to have no drift on left side, but is weak against resistance in LUE and LLE on exam.  Dr. Leroy Kennedy to bedside and discussed treatment with tPA with patient and family who verbally agreed with treatment plan. Pharmacy notified to mix tPA at 1346 and tPA started at 1403.  Following tPA being started, patient reports 5/10 headache which is improved with the lights being turned off.  Dr. Leroy Kennedy aware.  Patient and family educated at bedside and questions answered. Handoff completed with Gloris Manchester, ED RN.

## 2012-11-14 NOTE — ED Notes (Signed)
Pt having some tongue swelling and lip swelling. Called stroke team and sts possible allergic reaction to TPA. Pt denies trouble swallowing or breathing. No acute distress. sts doctor will order benadryl and steroids.

## 2012-11-14 NOTE — Progress Notes (Signed)
Pt passed RN swallow screen.  MD notified via text page to see about a diet order.  Waiting for MD response.

## 2012-11-14 NOTE — H&P (Signed)
Admission H&P    Chief Complaint: stroke HPI: Mathew Bernard is an 62 y.o. male who was eating lunch at 12 when he noted weakness of his left arm and leg. This concerned him and he called his wife.  His wife also noted some slurred speech. Patient was brought to [facility] hospital as code stroke.  Patient initial CT head was negative for bleed. Patient was given TPa and transferred to 3100.   LSN: 1200 tPA Given: Yes   Past Medical History  Diagnosis Date  . Hypertension   . High cholesterol   . Thyroid disease   . Complication of anesthesia      "depressed after surgery"  . Hypothyroidism   . Depression   . Anxiety   . Sleep apnea     CPAP  . Pneumonia     hx of in 70's  . History of kidney stones   . GERD (gastroesophageal reflux disease)   . Arthritis     Past Surgical History  Procedure Laterality Date  . Back surgery  2013    cervical  . Nerve surgery      helped with headaches  . Kidney stone surgery    . Lumbar laminectomy/decompression microdiscectomy N/A 10/07/2012    Procedure: MICRODISCECTOMY L4-5;  Surgeon: Javier Docker, MD;  Location: [facility];  Service: Orthopedics;  Laterality: N/A;    History reviewed. No pertinent family history. Social History:  reports that he has been smoking Cigarettes.  He has been smoking about 0.00 packs per day for the past 40 years. He has never used smokeless tobacco. He reports that he does not drink alcohol or use illicit drugs.  Allergies:  Allergies  Allergen Reactions  . Codeine     Jittery      (Not in a hospital admission)  ROS: All 12 review of system reviewed and normal except noted above  Physical Examination: There were no vitals taken for this visit.  HEENT-  Normocephalic, no lesions, without obvious abnormality.  Normal external eye and conjunctiva.  Normal TM's bilaterally.  Normal auditory canals and external ears. Normal external nose, mucus membranes and septum.  Normal pharynx. Neck supple with no  masses, nodes, nodules or enlargement. Cardiovascular - regular rate and rhythm, S1, S2 normal, no murmur, click, rub or gallop Lungs - chest clear, no wheezing, rales, normal symmetric air entry, Heart exam - S1, S2 normal, no murmur, no gallop, rate regular Abdomen - soft, non-tender; bowel sounds normal; no masses,  no organomegaly Extremities - less then 2 second capillary refill  Neurologic Examination: Mental Status: Alert, oriented, thought content appropriate.  Speech fluent without evidence of aphasia.  Able to follow 3 step commands without difficulty. Cranial Nerves: II: Discs flat bilaterally; Visual fields grossly normal, pupils equal, round, reactive to light and accommodation III,IV, VI: ptosis not present, extra-ocular motions intact bilaterally V,VII: face mild asymmetric left facial droop, facial light touch sensation normal bilaterally VIII: hearing normal bilaterally IX,X: gag reflex present XI: bilateral shoulder shrug XII: midline tongue extension Motor: Right : Upper extremity   5/5    Left:     Upper extremity   4/5  Lower extremity   5/5     Lower extremity   4/5 --drift on left arm and leg Tone and bulk:normal tone throughout; no atrophy noted Sensory: Pinprick and light touch intact throughout, bilaterally Deep Tendon Reflexes:  Right: Upper Extremity   Left: Upper extremity   biceps (C-5 to C-6) 2/4  biceps (C-5 to C-6) 2/4 tricep (C7) 2/4    triceps (C7) 2/4 Brachioradialis (C6) 2/4  Brachioradialis (C6) 2/4  Lower Extremity Lower Extremity  quadriceps (L-2 to L-4) 2/4   quadriceps (L-2 to L-4) 2/4 Achilles (S1) 2/4   Achilles (S1) 2/4  Plantars: Right: downgoing   Left: downgoing Cerebellar: normal finger-to-nose,  normal heel-to-shin test CV: pulses palpable throughout    Results for orders placed during the hospital encounter of 11/14/12 (from the past 48 hour(s))  ETHANOL     Status: None   Collection Time    11/14/12  1:18 PM      Result  Value Range   Alcohol, Ethyl (B) <11  0 - 11 mg/dL   Comment:            LOWEST DETECTABLE LIMIT FOR     SERUM ALCOHOL IS 11 mg/dL     FOR MEDICAL PURPOSES ONLY  PROTIME-INR     Status: None   Collection Time    11/14/12  1:18 PM      Result Value Range   Prothrombin Time 12.1  11.6 - 15.2 seconds   INR 0.91  0.00 - 1.49  APTT     Status: None   Collection Time    11/14/12  1:18 PM      Result Value Range   aPTT 35  24 - 37 seconds  CBC     Status: Abnormal   Collection Time    11/14/12  1:18 PM      Result Value Range   WBC 7.6  4.0 - 10.5 K/uL   RBC 4.98  4.22 - 5.81 MIL/uL   Hemoglobin 13.2  13.0 - 17.0 g/dL   HCT 78.2  95.6 - 21.3 %   MCV 81.5  78.0 - 100.0 fL   MCH 26.5  26.0 - 34.0 pg   MCHC 32.5  30.0 - 36.0 g/dL   RDW 08.6 (*) 57.8 - 46.9 %   Platelets 155  150 - 400 K/uL  DIFFERENTIAL     Status: None   Collection Time    11/14/12  1:18 PM      Result Value Range   Neutrophils Relative % 69  43 - 77 %   Neutro Abs 5.2  1.7 - 7.7 K/uL   Lymphocytes Relative 20  12 - 46 %   Lymphs Abs 1.5  0.7 - 4.0 K/uL   Monocytes Relative 8  3 - 12 %   Monocytes Absolute 0.6  0.1 - 1.0 K/uL   Eosinophils Relative 3  0 - 5 %   Eosinophils Absolute 0.2  0.0 - 0.7 K/uL   Basophils Relative 0  0 - 1 %   Basophils Absolute 0.0  0.0 - 0.1 K/uL  COMPREHENSIVE METABOLIC PANEL     Status: Abnormal   Collection Time    11/14/12  1:18 PM      Result Value Range   Sodium 137  135 - 145 mEq/L   Potassium 4.1  3.5 - 5.1 mEq/L   Chloride 104  96 - 112 mEq/L   CO2 26  19 - 32 mEq/L   Glucose, Bld 122 (*) 70 - 99 mg/dL   BUN 18  6 - 23 mg/dL   Creatinine, Ser 6.29  0.50 - 1.35 mg/dL   Calcium 9.0  8.4 - 52.8 mg/dL   Total Protein 6.8  6.0 - 8.3 g/dL   Albumin 3.5  3.5 - 5.2 g/dL   AST  22  0 - 37 U/L   ALT 28  0 - 53 U/L   Alkaline Phosphatase 87  39 - 117 U/L   Total Bilirubin 0.2 (*) 0.3 - 1.2 mg/dL   GFR calc non Af Amer 66 (*) >90 mL/min   GFR calc Af Amer 76 (*) >90 mL/min    Comment:            The eGFR has been calculated     using the CKD EPI equation.     This calculation has not been     validated in all clinical     situations.     eGFR's persistently     <90 mL/min signify     possible Chronic Kidney Disease.  TROPONIN I     Status: None   Collection Time    11/14/12  1:18 PM      Result Value Range   Troponin I <0.30  <0.30 ng/mL   Comment:            Due to the release kinetics of cTnI,     a negative result within the first hours     of the onset of symptoms does not rule out     myocardial infarction with certainty.     If myocardial infarction is still suspected,     repeat the test at appropriate intervals.  POCT I-STAT, CHEM 8     Status: Abnormal   Collection Time    11/14/12  1:27 PM      Result Value Range   Sodium 139  135 - 145 mEq/L   Potassium 4.2  3.5 - 5.1 mEq/L   Chloride 105  96 - 112 mEq/L   BUN 19  6 - 23 mg/dL   Creatinine, Ser 4.78  0.50 - 1.35 mg/dL   Glucose, Bld 295 (*) 70 - 99 mg/dL   Calcium, Ion 6.21  3.08 - 1.30 mmol/L   TCO2 24  0 - 100 mmol/L   Hemoglobin 14.3  13.0 - 17.0 g/dL   HCT 65.7  84.6 - 96.2 %  POCT I-STAT TROPONIN I     Status: None   Collection Time    11/14/12  1:29 PM      Result Value Range   Troponin i, poc 0.00  0.00 - 0.08 ng/mL   Comment 3            Comment: Due to the release kinetics of cTnI,     a negative result within the first hours     of the onset of symptoms does not rule out     myocardial infarction with certainty.     If myocardial infarction is still suspected,     repeat the test at appropriate intervals.  GLUCOSE, CAPILLARY     Status: Abnormal   Collection Time    11/14/12  1:33 PM      Result Value Range   Glucose-Capillary 124 (*) 70 - 99 mg/dL   Ct Head Wo Contrast  11/14/2012   *RADIOLOGY REPORT*  Clinical Data: Code stroke.  Sudden onset left-sided weakness at new.  CT HEAD WITHOUT CONTRAST  Technique:  Contiguous axial images were obtained from the base  of the skull through the vertex without contrast.  Comparison: 07/07/2012.  Findings: There is increased density in the left middle cerebral artery (image number 13 series 3).  This is suspicious for hyperdense MCA sign and thrombosis.  There are no secondary signs of left  MCA infarction.  Old left basal ganglia lacunar infarct is present.  Gray-white differentiation along the insular ribbon appears intact.  Old right caudate infarct is unchanged.  Posterior fossa appears within normal limits.  No mass lesion, midline shift, hydrocephalus or hemorrhage.  The calvarium is intact.  Chronic right maxillary mucosal sinus disease.  IMPRESSION: High density in the left middle cerebral artery was probably present on the prior examination 07/07/2012.  This may represent partial volume averaging of a atherosclerosis or chronic hyperdense left MCA sign.  There are no secondary signs of cerebral ischemia. No definite acute intracranial abnormality.  Old small lacunar infarcts are unchanged.  Critical Value/emergent results were called by telephone at the time of interpretation on 11/14/2012 at 1350 hours to [phone], Dr. Leroy Kennedy, who verbally acknowledged these results.   Original Report Authenticated By: Andreas Newport, M.D.    Assessment: 62 y.o. male with probable right subcortical/lacunar infarct.  Patient will be admitted to 3100 and receive TPa.  Orders have been entered.   Stroke Risk Factors - hyperlipidemia and hypertension  Plan: 1. HgbA1c, fasting lipid panel 2. MRI, MRA  of the brain without contrast 3. PT consult, OT consult, Speech consult 4. Echocardiogram 5. Carotid dopplers 6. Prophylactic therapy-None 7. Risk factor modification 7. Telemetry monitoring   Assessment and plan discussed with with attending physician and they are in agreement.   Felicie Morn PA-C [facility] [phone]  11/14/2012, 2:12 PM   Patient seen and examined together with physician assistant and I  concur with the assessment and plan.  Wyatt Portela, MD

## 2012-11-14 NOTE — ED Notes (Addendum)
Pt lip swelling is improving. Pt still a little anxious from the medication

## 2012-11-14 NOTE — ED Notes (Signed)
Pt having some increased swelling in lips. Pt sts that he feels a little anxious. Stroke team aware and Neuro at bedside seeing pt.

## 2012-11-14 NOTE — ED Provider Notes (Signed)
Medical screening examination/treatment/procedure(s) were performed by non-physician practitioner and as supervising physician I was immediately available for consultation/collaboration.  Geoffery Lyons, MD 11/14/12 1446

## 2012-11-14 NOTE — ED Notes (Signed)
Per pt sts about 1200 pm began to feel funny and left sided facial drop, left sided weakness and slurred speech. sts he called his wife and she called 911.

## 2012-11-15 ENCOUNTER — Inpatient Hospital Stay (HOSPITAL_COMMUNITY): Payer: BC Managed Care – PPO

## 2012-11-15 DIAGNOSIS — I6789 Other cerebrovascular disease: Secondary | ICD-10-CM

## 2012-11-15 DIAGNOSIS — I6350 Cerebral infarction due to unspecified occlusion or stenosis of unspecified cerebral artery: Principal | ICD-10-CM

## 2012-11-15 LAB — LIPID PANEL
Cholesterol: 271 mg/dL — ABNORMAL HIGH (ref 0–200)
LDL Cholesterol: 206 mg/dL — ABNORMAL HIGH (ref 0–99)
VLDL: 26 mg/dL (ref 0–40)

## 2012-11-15 LAB — HEMOGLOBIN A1C: Hgb A1c MFr Bld: 6.6 % — ABNORMAL HIGH (ref <5.7)

## 2012-11-15 MED ORDER — PANTOPRAZOLE SODIUM 40 MG PO TBEC
40.0000 mg | DELAYED_RELEASE_TABLET | Freq: Every day | ORAL | Status: DC
  Administered 2012-11-15 – 2012-11-16 (×2): 40 mg via ORAL
  Filled 2012-11-15 (×2): qty 1

## 2012-11-15 MED ORDER — OFLOXACIN 0.3 % OP SOLN
1.0000 [drp] | Freq: Four times a day (QID) | OPHTHALMIC | Status: DC
  Administered 2012-11-15 – 2012-11-16 (×5): 1 [drp] via OPHTHALMIC
  Filled 2012-11-15: qty 5

## 2012-11-15 MED ORDER — ASPIRIN EC 325 MG PO TBEC
325.0000 mg | DELAYED_RELEASE_TABLET | Freq: Every day | ORAL | Status: DC
  Administered 2012-11-15 – 2012-11-16 (×2): 325 mg via ORAL
  Filled 2012-11-15 (×3): qty 1

## 2012-11-15 MED ORDER — PREDNISOLONE ACETATE 1 % OP SUSP
1.0000 [drp] | Freq: Two times a day (BID) | OPHTHALMIC | Status: DC
  Administered 2012-11-15 – 2012-11-16 (×4): 1 [drp] via OPHTHALMIC
  Filled 2012-11-15: qty 1

## 2012-11-15 MED ORDER — SIMVASTATIN 40 MG PO TABS
40.0000 mg | ORAL_TABLET | Freq: Every day | ORAL | Status: DC
  Administered 2012-11-15: 40 mg via ORAL
  Filled 2012-11-15 (×3): qty 1

## 2012-11-15 NOTE — Evaluation (Signed)
Physical Therapy Evaluation Patient Details Name: Mathew Bernard MRN: [id number] DOB: June 16, 1950 Today's Date: 11/15/2012 Time: 4098-1191 PT Time Calculation (min): 17 min  PT Assessment / Plan / Recommendation History of Present Illness  Mathew Bernard is an 62 y.o. male who was eating lunch at 12 when he noted weakness of his left arm and leg. This concerned him and he called his wife.  His wife also noted some slurred speech. Patient was brought to [facility] hospital as code stroke.  Patient initial CT head was negative for bleed. Patient was given TPa and transferred to 3100.  Clinical Impression  Pt admitted with above. Pt currently with functional limitations due to the deficits listed below (see PT Problem List).  Pt will benefit from skilled PT to increase their independence and safety with mobility to allow discharge to the venue listed below and with support of wife.      PT Assessment  Patient needs continued PT services    Follow Up Recommendations  Outpatient PT (pt has been receiving OPPT for his back and would like to return to that practice)    Does the patient have the potential to tolerate intense rehabilitation      Barriers to Discharge        Equipment Recommendations  None recommended by PT    Recommendations for Other Services     Frequency Min 4X/week    Precautions / Restrictions Precautions Precautions: Fall   Pertinent Vitals/Pain HR 114 with amb      Mobility  Bed Mobility Bed Mobility: Sit to Supine Supine to Sit: 4: Min assist;HOB flat Sitting - Scoot to Edge of Bed: 4: Min guard Sit to Supine: 5: Supervision Details for Bed Mobility Assistance: Assist to bring trunk up. Transfers Sit to Stand: 4: Min assist;With upper extremity assist;From bed Stand to Sit: 4: Min assist;With upper extremity assist;With armrests;To chair/3-in-1 Details for Transfer Assistance: Assist for balance Ambulation/Gait Ambulation/Gait Assistance: 4: Min  assist Ambulation Distance (Feet): 300 Feet Assistive device: 1 person hand held assist;None Ambulation/Gait Assistance Details: Assist for balance.  Pt with slight lateral lean to left at times.  Pt also comes very close to or brushes objects on his left side when amb. Gait Pattern: Step-through pattern;Decreased stride length Modified Rankin (Stroke Patients Only) Pre-Morbid Rankin Score: No symptoms Modified Rankin: Moderately severe disability    Exercises     PT Diagnosis: Difficulty walking  PT Problem List: Decreased balance;Decreased mobility PT Treatment Interventions: DME instruction;Gait training;Stair training;Therapeutic activities;Functional mobility training;Balance training;Patient/family education     PT Goals(Current goals can be found in the care plan section) Acute Rehab PT Goals Patient Stated Goal: To go home PT Goal Formulation: With patient Time For Goal Achievement: 11/22/12 Potential to Achieve Goals: Good  Visit Information  Last PT Received On: 11/15/12 Assistance Needed: +1 History of Present Illness: Mathew Bernard is an 62 y.o. male who was eating lunch at 12 when he noted weakness of his left arm and leg. This concerned him and he called his wife.  His wife also noted some slurred speech. Patient was brought to [facility] hospital as code stroke.  Patient initial CT head was negative for bleed. Patient was given TPa and transferred to 3100.       Prior Functioning  Home Living Family/patient expects to be discharged to:: Private residence Living Arrangements: Spouse/significant other Available Help at Discharge: Family;Available 24 hours/day Type of Home: House Home Access: Stairs to enter [employer] of Steps:  4 Entrance Stairs-Rails: None Home Layout: One level Home Equipment: Cane - single point Additional Comments: walking stick Prior Function Level of Independence: Independent Communication Communication: No difficulties Dominant  Hand: Right    Cognition  Cognition Arousal/Alertness: Awake/alert Behavior During Therapy: WFL for tasks assessed/performed Overall Cognitive Status: Within Functional Limits for tasks assessed    Extremity/Trunk Assessment Lower Extremity Assessment Lower Extremity Assessment: Overall WFL for tasks assessed   Balance Balance Balance Assessed: Yes Static Standing Balance Static Standing - Balance Support: No upper extremity supported Static Standing - Level of Assistance: 4: Min assist  End of Session PT - End of Session Equipment Utilized During Treatment: Gait belt Activity Tolerance: Patient tolerated treatment well Patient left: in bed;with call bell/phone within reach;with family/visitor present  GP     [facility] 11/15/2012, 3:35 PM  [facility] PT [phone]

## 2012-11-15 NOTE — Evaluation (Signed)
Occupational Therapy Evaluation Patient Details Name: Mathew Bernard MRN: [id number] DOB: [birth year] Today's Date: 11/15/2012 Time: 1478-2956 OT Time Calculation (min): 24 min  OT Assessment / Plan / Recommendation History of present illness Mathew Bernard is an 62 y.o. male who was eating lunch at 12 when he noted weakness of his left arm and leg. This concerned him and he called his wife.  His wife also noted some slurred speech. Patient was brought to [facility] hospital as code stroke.  Patient initial CT head was negative for bleed. Patient was given TPa and transferred to 3100.   Clinical Impression   This 62 yo male admitted with LUE/LE weakness and slurred speech presents to acute OT with mild balance deficits and left shoulder deficits. Will benefit from acute OT without need for follow up post acute.    OT Assessment  Patient needs continued OT Services    Follow Up Recommendations  No OT follow up       Equipment Recommendations  Tub/shower seat (pt/family to get on their own)       Frequency  Min 2X/week    Precautions / Restrictions Precautions Precautions: Fall Restrictions Weight Bearing Restrictions: No       ADL  Eating/Feeding: Simulated;Independent Where Assessed - Eating/Feeding: Chair Grooming: Simulated;Set up Where Assessed - Grooming: Unsupported sitting Upper Body Bathing: Simulated;Set up Where Assessed - Upper Body Bathing: Unsupported sitting Lower Body Bathing: Simulated;Minimal assistance Where Assessed - Lower Body Bathing: Supported sit to stand Upper Body Dressing: Simulated;Set up Where Assessed - Upper Body Dressing: Unsupported sitting Lower Body Dressing: Performed;Minimal assistance Where Assessed - Lower Body Dressing: Supported sit to stand Toilet Transfer: Simulated;Minimal assistance Toilet Transfer Method: Sit to Barista:  (Bed>around unit>recliner) Toileting - Architect and Hygiene:  Simulated;Minimal assistance Where Assessed - Engineer, mining and Hygiene: Sit to stand from 3-in-1 or toilet Equipment Used: Gait belt Transfers/Ambulation Related to ADLs: Min A for without AD    OT Diagnosis: Generalized weakness  OT Problem List: Decreased strength;Impaired balance (sitting and/or standing);Decreased knowledge of use of DME or AE OT Treatment Interventions: Self-care/ADL training;Balance training;DME and/or AE instruction;Patient/family education   OT Goals(Current goals can be found in the care plan section) Acute Rehab OT Goals Patient Stated Goal: To go home OT Goal Formulation: With patient Time For Goal Achievement: 11/22/12 Potential to Achieve Goals: Good  Visit Information  Last OT Received On: 11/15/12 Assistance Needed: +1 History of Present Illness: Mathew Bernard is an 62 y.o. male who was eating lunch at 12 when he noted weakness of his left arm and leg. This concerned him and he called his wife.  His wife also noted some slurred speech. Patient was brought to [facility] hospital as code stroke.  Patient initial CT head was negative for bleed. Patient was given TPa and transferred to 3100.       Prior Functioning     Home Living Family/patient expects to be discharged to:: Private residence Living Arrangements: Spouse/significant other Available Help at Discharge: Family;Available 24 hours/day Type of Home: House Home Access: Stairs to enter [employer] of Steps: 4 Entrance Stairs-Rails: None Home Layout: One level Home Equipment: Cane - single point Prior Function Level of Independence: Independent Communication Communication: No difficulties Dominant Hand: Right         Vision/Perception Vision - History Baseline Vision: Wears glasses only for reading Visual History:  (right lens implant) Patient Visual Report: No change from baseline Vision - Assessment  Eye Alignment: Within Functional Limits Vision  Assessment: Vision tested Ocular Range of Motion: Within Functional Limits Alignment/Gaze Preference: Within Defined Limits Tracking/Visual Pursuits: Able to track stimulus in all quads without difficulty Convergence: Within functional limits Visual Fields: No apparent deficits   Cognition  Cognition Behavior During Therapy: WFL for tasks assessed/performed Overall Cognitive Status: Within Functional Limits for tasks assessed    Extremity/Trunk Assessment Upper Extremity Assessment Upper Extremity Assessment: LUE deficits/detail LUE Deficits / Details: Mild weakness with shoulder flexion; when individual joints are tested pt is 5/5; however when full arm is raised then shoulder weakness is seen compared to RUE LUE Coordination: decreased gross motor     Mobility Bed Mobility Bed Mobility: Supine to Sit;Sitting - Scoot to Edge of Bed Supine to Sit: 4: Min guard;With rails;HOB elevated Sitting - Scoot to Edge of Bed: 4: Min guard Transfers Transfers: Sit to Stand;Stand to Sit Sit to Stand: 4: Min assist;With upper extremity assist;From bed Stand to Sit: 4: Min assist;With upper extremity assist;With armrests;To chair/3-in-1           End of Session OT - End of Session Equipment Utilized During Treatment: Gait belt Activity Tolerance: Patient tolerated treatment well Patient left: in chair;with call bell/phone within reach;with family/visitor present       Evette Georges [phone] 11/15/2012, 11:24 AM

## 2012-11-15 NOTE — Progress Notes (Signed)
Patient requesting Bi pap machine for tonight due to sleep apnea.  Dr. Thad Ranger called and made aware.  New orders placed.  Will continue to monitor.Jacqulynn Cadet

## 2012-11-15 NOTE — Progress Notes (Signed)
Patient placed on CPAP.  Home settings 11 cm H2O.  Wears nasal mask at home.  RT will continue to monitor.

## 2012-11-15 NOTE — Progress Notes (Signed)
Stroke Team Progress Note  HISTORY HPI: Mathew Bernard is an 62 y.o. male who was eating lunch at 12 when he noted weakness of his left arm and leg. This concerned him and he called his wife. His wife also noted some slurred speech. Patient was brought to [facility] hospital as code stroke. Patient initial CT head was negative for bleed. Patient was given TPa and transferred to 3100.   LSN: [id number]  tPA Given: Yes   He was admitted to the neuro ICU  for further evaluation and treatment. After tPA he did have some lip and tongue swelling which responded to solumedrol and benadryl.  SUBJECTIVE His wife is at the bedside.  Overall he feels his condition is rapidly improving. He has some dysarthria and slight facial droop.  Has had soreness with several lipid agents.  OBJECTIVE Most recent Vital Signs: Filed Vitals:   11/15/12 0400 11/15/12 0500 11/15/12 0600 11/15/12 0700  BP: 112/51 125/61 141/44 134/71  Pulse: 51 51 50 51  Temp: 98.2 F (36.8 C)     TempSrc: Oral     Resp: 19 21 21 20   Height:      Weight:      SpO2: 95% 95% 97% 97%   CBG (last 3)   Recent Labs  11/14/12 1333  GLUCAP 124*    IV Fluid Intake:   . sodium chloride 75 mL/hr at 11/15/12 0600    MEDICATIONS  .  stroke: mapping our early stages of recovery book   Does not apply Once  . ofloxacin  1 drop Right Eye QID  . pantoprazole (PROTONIX) IV  40 mg Intravenous QHS  . prednisoLONE acetate  1 drop Right Eye BID   PRN:  acetaminophen, acetaminophen, labetalol, senna-docusate  Diet:  Cardiac thin liquids Activity:  Bedrest  DVT Prophylaxis:  SCD  CLINICALLY SIGNIFICANT STUDIES Basic Metabolic Panel:  Recent Labs Lab 11/14/12 1318 11/14/12 1327  NA 137 139  K 4.1 4.2  CL 104 105  CO2 26  --   GLUCOSE 122* 119*  BUN 18 19  CREATININE 1.16 1.10  CALCIUM 9.0  --    Liver Function Tests:  Recent Labs Lab 11/14/12 1318  AST 22  ALT 28  ALKPHOS 87  BILITOT 0.2*  PROT 6.8  ALBUMIN 3.5   CBC:   Recent Labs Lab 11/14/12 1318 11/14/12 1327  WBC 7.6  --   NEUTROABS 5.2  --   HGB 13.2 14.3  HCT 40.6 42.0  MCV 81.5  --   PLT 155  --    Coagulation:  Recent Labs Lab 11/14/12 1318  LABPROT 12.1  INR 0.91   Cardiac Enzymes:  Recent Labs Lab 11/14/12 1318  TROPONINI <0.30   Urinalysis:  Recent Labs Lab 11/14/12 1508  COLORURINE YELLOW  LABSPEC 1.013  PHURINE 6.0  GLUCOSEU NEGATIVE  HGBUR NEGATIVE  BILIRUBINUR NEGATIVE  KETONESUR NEGATIVE  PROTEINUR NEGATIVE  UROBILINOGEN 0.2  NITRITE NEGATIVE  LEUKOCYTESUR NEGATIVE   Lipid Panel    Component Value Date/Time   CHOL 271* 11/15/2012 0455   TRIG 128 11/15/2012 0455   HDL 39* 11/15/2012 0455   CHOLHDL 6.9 11/15/2012 0455   VLDL 26 11/15/2012 0455   LDLCALC 206* 11/15/2012 0455   HgbA1C  No results found for this basename: HGBA1C    Urine Drug Screen:     Component Value Date/Time   LABOPIA NONE DETECTED 11/14/2012 1508   COCAINSCRNUR NONE DETECTED 11/14/2012 1508   LABBENZ NONE DETECTED 11/14/2012 1508  AMPHETMU NONE DETECTED 11/14/2012 1508   THCU NONE DETECTED 11/14/2012 1508   LABBARB NONE DETECTED 11/14/2012 1508    Alcohol Level:  Recent Labs Lab 11/14/12 1318  ETH <11    Dg Chest 2 View 11/14/2012  Shallow inspiration.  No evidence of active pulmonary disease.  Ct Head Wo Contrast 11/14/2012    High density in the left middle cerebral artery was probably present on the prior examination 07/07/2012.  This may represent partial volume averaging of a atherosclerosis or chronic hyperdense left MCA sign.  There are no secondary signs of cerebral ischemia. No definite acute intracranial abnormality.  Old small lacunar infarcts are unchanged.   MRI of the brain    MRA of the brain    2D Echocardiogram    Carotid Doppler  Bilateral: Less than 39% ICA stenosis. Vertebral artery flow is antegrade.   EKG  .   Therapy Recommendations outpatient PT  Physical Exam   Neurologic Examination:  Mental  Status:  Alert, oriented, thought content appropriate. Speech fluent without evidence of aphasia. Able to follow 3 step commands without difficulty.  Cranial Nerves:  II:  Visual fields grossly normal, pupils equal, round. III,IV, VI: ptosis not present, extra-ocular motions intact bilaterally  V,VII: face mild asymmetric left facial droop, facial light touch sensation normal bilaterally  VIII: hearing normal bilaterally  IX,X: gag reflex present  XI: bilateral shoulder shrug  XII: midline tongue extension  Motor:  Right : Upper extremity 5/5 Left: Upper extremity 4/5  Lower extremity 5/5 Lower extremity 4/5  --drift on left arm and leg  Tone and bulk:normal tone throughout; no atrophy noted  Sensory: Pinprick and light touch intact throughout, bilaterally  Plantars:  Right: downgoing Left: downgoing  Cerebellar:  normal finger-to-nose, normal heel-to-shin test  CV: pulses palpable throughout    ASSESSMENT Mr. Mathew Bernard is a 62 y.o. male presenting with left hemiparesis, slurred speech. Status post IV t-PA full dose  at 1402. Suspect right brain subcortical infarct . On no antithrombotics prior to admission. Now on no antithrombotics until follow up scan for secondary stroke prevention. Patient with resultant left hemiparesis. Work up underway.    Hypertension  Hyperlipidemia, LDL 206  Sleep apnea  hgb Z6X, 6.6  Hospital day # 1  TREATMENT/PLAN  Will review CT for any sign of bleed, if not will add Aspirin 325mg  daily  for secondary stroke prevention.  Increase zocor to 40mg  daily plus coenzyme Q10  Long term risk factor modification  Allergy to tPA, would have to be treated if needed to be used in the future.  Gwendolyn Lima. Manson Passey, [facility], MBA, MHA [facility] Pager: [phone] 11/15/2012 7:42 AM This patient is critically ill and at significant risk of neurological worsening, death and care requires constant monitoring of vital signs,  hemodynamics,respiratory and cardiac monitoring,review of multiple databases, neurological assessment, discussion with family, other specialists and medical decision making of high complexity. I spent 30 minutes of neurocritical care time  in the care of  this patient. I have personally obtained a history, examined the patient, evaluated imaging results, and formulated the assessment and plan of care. I agree with the above. Delia Heady, MD

## 2012-11-15 NOTE — Progress Notes (Signed)
Utilization Review Completed.Espen Bethel T7/22/2014  

## 2012-11-15 NOTE — Progress Notes (Signed)
Patient's family requesting two eye drop medication for recent cataract surgery that were originally forgotten on initial admission medication list.  Pharmacy has added medications to the list.  MD notified.  Will wait for new orders. Jacqulynn Cadet

## 2012-11-15 NOTE — Progress Notes (Signed)
VASCULAR LAB PRELIMINARY  PRELIMINARY  PRELIMINARY  PRELIMINARY  Carotid duplex  completed.    Preliminary report:  Bilateral:  Less than 39% ICA stenosis.  Vertebral artery flow is antegrade.      Derrius Furtick, RVT 11/15/2012, 4:36 PM

## 2012-11-15 NOTE — Progress Notes (Signed)
PT Cancellation Note  Patient Details Name: MAHONRI SEIDEN MRN: [id number] DOB: 04-12-1951   Cancelled Treatment:    Reason Eval/Treat Not Completed: Medical issues which prohibited therapy. Nurse, Renae Fickle stated that pt was going for CT at 1400 and tpa was given and would need to be seen after this time when PT checked on pt at 0900.  PT was on bedrest and needed activity level as well.  Renae Fickle later informed PT that pt could be seen about 1000 am.  OT was seeing pt at 1100 am when PT returned.  Will reattempt in pm as able.  Thanks.   INGOLD,Kelsie Zaborowski 11/15/2012, 11:09 AM [facility] [phone] (pager)

## 2012-11-15 NOTE — Progress Notes (Signed)
  Echocardiogram 2D Echocardiogram has been performed.  Mathew Bernard FRANCES 11/15/2012, 4:21 PM

## 2012-11-16 ENCOUNTER — Inpatient Hospital Stay (HOSPITAL_COMMUNITY): Payer: BC Managed Care – PPO

## 2012-11-16 DIAGNOSIS — Z8673 Personal history of transient ischemic attack (TIA), and cerebral infarction without residual deficits: Secondary | ICD-10-CM

## 2012-11-16 DIAGNOSIS — I10 Essential (primary) hypertension: Secondary | ICD-10-CM

## 2012-11-16 DIAGNOSIS — I63512 Cerebral infarction due to unspecified occlusion or stenosis of left middle cerebral artery: Secondary | ICD-10-CM

## 2012-11-16 DIAGNOSIS — E785 Hyperlipidemia, unspecified: Secondary | ICD-10-CM

## 2012-11-16 DIAGNOSIS — G4733 Obstructive sleep apnea (adult) (pediatric): Secondary | ICD-10-CM

## 2012-11-16 MED ORDER — SIMVASTATIN 40 MG PO TABS: 40.0000 mg | ORAL_TABLET | Freq: Every day | ORAL | Status: DC

## 2012-11-16 MED ORDER — ASPIRIN 325 MG PO TBEC: 325.0000 mg | DELAYED_RELEASE_TABLET | Freq: Every day | ORAL | Status: DC

## 2012-11-16 NOTE — Progress Notes (Signed)
Stroke Team Progress Note  HISTORY HPI: Mathew Bernard is an 62 y.o. male who was eating lunch at 12 when he noted weakness of his left arm and leg. This concerned him and he called his wife. His wife also noted some slurred speech. Patient was brought to [facility] hospital as code stroke. Patient initial CT head was negative for bleed. Patient was given TPa and transferred to 3100.   LSN: [id number]  tPA Given: Yes   He was admitted to the neuro ICU  for further evaluation and treatment. After tPA he did have some lip and tongue swelling which responded to solumedrol and benadryl.  SUBJECTIVE His wife is at the bedside.  Overall he feels his condition is rapidly improving. Ready for d/c home.  Has had soreness with several lipid agents.  OBJECTIVE Most recent Vital Signs: Filed Vitals:   11/16/12 0500 11/16/12 0600 11/16/12 0700 11/16/12 0757  BP: 148/86 150/76 162/62   Pulse: 43 43 47 50  Temp:   98 F (36.7 C)   TempSrc:   Oral   Resp: 15  18 20   Height:      Weight:      SpO2: 98% 98% 97% 99%   CBG (last 3)   Recent Labs  11/14/12 1333  GLUCAP 124*    IV Fluid Intake:   . sodium chloride 75 mL/hr at 11/16/12 0600    MEDICATIONS  .  stroke: mapping our early stages of recovery book   Does not apply Once  . aspirin EC  325 mg Oral Daily  . ofloxacin  1 drop Right Eye QID  . pantoprazole  40 mg Oral Daily  . prednisoLONE acetate  1 drop Right Eye BID  . simvastatin  40 mg Oral q1800   PRN:  acetaminophen, acetaminophen, labetalol, senna-docusate  Diet:  Cardiac thin liquids Activity:  Bedrest  DVT Prophylaxis:  SCD  CLINICALLY SIGNIFICANT STUDIES Basic Metabolic Panel:   Recent Labs Lab 11/14/12 1318 11/14/12 1327  NA 137 139  K 4.1 4.2  CL 104 105  CO2 26  --   GLUCOSE 122* 119*  BUN 18 19  CREATININE 1.16 1.10  CALCIUM 9.0  --    Liver Function Tests:   Recent Labs Lab 11/14/12 1318  AST 22  ALT 28  ALKPHOS 87  BILITOT 0.2*  PROT 6.8  ALBUMIN 3.5    CBC:   Recent Labs Lab 11/14/12 1318 11/14/12 1327  WBC 7.6  --   NEUTROABS 5.2  --   HGB 13.2 14.3  HCT 40.6 42.0  MCV 81.5  --   PLT 155  --    Coagulation:   Recent Labs Lab 11/14/12 1318  LABPROT 12.1  INR 0.91   Cardiac Enzymes:   Recent Labs Lab 11/14/12 1318  TROPONINI <0.30   Urinalysis:   Recent Labs Lab 11/14/12 1508  COLORURINE YELLOW  LABSPEC 1.013  PHURINE 6.0  GLUCOSEU NEGATIVE  HGBUR NEGATIVE  BILIRUBINUR NEGATIVE  KETONESUR NEGATIVE  PROTEINUR NEGATIVE  UROBILINOGEN 0.2  NITRITE NEGATIVE  LEUKOCYTESUR NEGATIVE   Lipid Panel    Component Value Date/Time   CHOL 271* 11/15/2012 0455   TRIG 128 11/15/2012 0455   HDL 39* 11/15/2012 0455   CHOLHDL 6.9 11/15/2012 0455   VLDL 26 11/15/2012 0455   LDLCALC 206* 11/15/2012 0455   HgbA1C  Lab Results  Component Value Date   HGBA1C 6.6* 11/15/2012    Urine Drug Screen:     Component Value Date/Time  LABOPIA NONE DETECTED 11/14/2012 1508   COCAINSCRNUR NONE DETECTED 11/14/2012 1508   LABBENZ NONE DETECTED 11/14/2012 1508   AMPHETMU NONE DETECTED 11/14/2012 1508   THCU NONE DETECTED 11/14/2012 1508   LABBARB NONE DETECTED 11/14/2012 1508    Alcohol Level:   Recent Labs Lab 11/14/12 1318  ETH <11    Dg Chest 2 View 11/14/2012  Shallow inspiration.  No evidence of active pulmonary disease.  Ct Head Wo Contrast 11/14/2012    High density in the left middle cerebral artery was probably present on the prior examination 07/07/2012.  This may represent partial volume averaging of a atherosclerosis or chronic hyperdense left MCA sign.  There are no secondary signs of cerebral ischemia. No definite acute intracranial abnormality.  Old small lacunar infarcts are unchanged.   MRI of the brain    MRA of the brain    2D Echocardiogram  Left ventricle: The cavity size was normal. Systolicfunction was normal. The estimated ejection fraction was in the range of 55% to 60%. Wall motion was  normal  Carotid Doppler  Bilateral: Less than 39% ICA stenosis. Vertebral artery flow is antegrade.   EKG  .   Therapy Recommendations outpatient PT  Physical Exam   Neurologic Examination:  Mental Status:  Alert, oriented, thought content appropriate. Speech fluent without evidence of aphasia. Able to follow 3 step commands without difficulty.  Cranial Nerves:  II:  Visual fields grossly normal, pupils equal, round. III,IV, VI: ptosis not present, extra-ocular motions intact bilaterally  V,VII: face mild asymmetric left facial droop, facial light touch sensation normal bilaterally  VIII: hearing normal bilaterally  IX,X: gag reflex present  XI: bilateral shoulder shrug  XII: midline tongue extension  Motor:  Right : Upper extremity 5/5 Left: Upper extremity 4/5  Lower extremity 5/5 Lower extremity 4/5  --drift on left arm and leg  Tone and bulk:normal tone throughout; no atrophy noted  Sensory: Pinprick and light touch intact throughout, bilaterally  Plantars:  Right: downgoing Left: downgoing  Cerebellar:  normal finger-to-nose, normal heel-to-shin test  CV: pulses palpable throughout    ASSESSMENT Mr. Mathew Bernard is a 62 y.o. male presenting with left hemiparesis, slurred speech. Status post IV t-PA full dose  at 1402. Suspect right brain subcortical infarct . On no antithrombotics prior to admission. Now on no antithrombotics until follow up scan for secondary stroke prevention. Patient with resultant left hemiparesis. Work up underway.    Hypertension  Hyperlipidemia, LDL 206  Sleep apnea  hgb Z3Y, 6.6  Hospital day # 2  TREATMENT/PLAN  CT stable, Aspirin added.  Increase zocor to 40mg  daily plus coenzyme Q10  Long term risk factor modification  Allergy to tPA, would have to be treated if needed to be used in the future.  MRI study pending.  If results stable, plan to discharge today.  Daughter later came in and stated she was concerned about  depression in patient and felt he would be best treated by mental health professional. She has contact with Dr. Lubertha Basque and will acquire an appt.  She also had concerns regarding his smoking. We discussed smoking Cessation and I discussed smoking cessation and options including nicoderm patches and dosing 67mcg/daily for 2 weeks, then decrease to daily for 2 weeks, then d/c. In addition, with anti-depressant, this too will assist with attempt.  Follow up with Dr. Pearlean Brownie in 2 weeks.  Dr. Pearlean Brownie has spent 30 minutes with patient, wife and daughter discussing disease process, plan of  care and discharge planning.  Gwendolyn Lima. Manson Passey, [facility], MBA, MHA [facility] Pager: [phone] 11/16/2012 8:15 AM  I have personally obtained a history, examined the patient, evaluated imaging results, and formulated the assessment and plan of care. I agree with the above.  Delia Heady, MD

## 2012-11-16 NOTE — Progress Notes (Signed)
Physical Therapy Treatment Patient Details Name: Mathew Bernard MRN: [id number] DOB: Jan 19, 1951 Today's Date: 11/16/2012 Time: 8295-6213 PT Time Calculation (min): 31 min  PT Assessment / Plan / Recommendation  History of Present Illness Mathew Bernard is an 62 y.o. male who was eating lunch at 12 when he noted weakness of his left arm and leg. This concerned him and he called his wife.  His wife also noted some slurred speech. Patient was brought to [facility] hospital as code stroke.  Patient initial CT head was negative for bleed. Patient was given TPa and transferred to 3100.   Clinical Impression Pt admitted with CVA. Pt currently with functional limitations due to continued minor balance deficits with challenges.  Plans to f/u with Outpt therapy at d/c.  Pt will benefit from skilled PT to increase their independence and safety with mobility to allow discharge to the venue listed below.    PT Comments   Discussed equipment stores for DME with wife.  Also discussed that she should ambulate with him on his left given that he brushes up against objects at times.  Pt aware to use cane at all times on d/c initially.  Gave pt an exercise handout to work on at home.  ALso, wife reported that pt was coughing with meals and ST had not been in therefore called ST and they came up right after to evaluate pt.    Follow Up Recommendations  Outpatient PT                 Equipment Recommendations  None recommended by PT        Frequency Min 4X/week   Progress towards PT Goals Progress towards PT goals: Progressing toward goals  Plan Current plan remains appropriate    Precautions / Restrictions Precautions Precautions: Fall Restrictions Weight Bearing Restrictions: No   Pertinent Vitals/Pain VSS, No pain    Mobility  Bed Mobility Bed Mobility: Sit to Supine Supine to Sit: HOB flat;5: Supervision Sitting - Scoot to Edge of Bed: 5: Supervision Sit to Supine: Not Tested  (comment) Transfers Transfers: Sit to Stand;Stand to Sit Sit to Stand: 5: Supervision;With upper extremity assist;From chair/3-in-1;With armrests Stand to Sit: 5: Supervision;With upper extremity assist;With armrests;To chair/3-in-1 Ambulation/Gait Ambulation/Gait Assistance: 5: Supervision;4: Min guard Ambulation Distance (Feet): 500 Feet Assistive device: Straight cane Ambulation/Gait Assistance Details: No asssit needed for balance.  Pt becoming aware to look left and did not brush objects as he did yesterday.  Pt needed min guard assist only for lines.   Gait Pattern: Step-through pattern;Decreased stride length Gait velocity: WFL Stairs: Yes Stairs Assistance: 5: Supervision Stairs Assistance Details (indicate cue type and reason): no cues needed Stair Management Technique: One rail Left;Forwards;Step to pattern;Alternating pattern;Other (comment) (alternating on way up no rail and step to on way down w rail) Number of Stairs: 4 Modified Rankin (Stroke Patients Only) Pre-Morbid Rankin Score: No symptoms Modified Rankin: Moderately severe disability    Exercises General Exercises - Lower Extremity Ankle Circles/Pumps: AROM;Both;10 reps;Seated Short Arc Quad: AROM;Both;10 reps;Supine Heel Slides: AROM;Both;10 reps;Supine Hip ABduction/ADduction: AROM;Both;10 reps;Supine Straight Leg Raises: AROM;Both;10 reps;Supine    PT Goals (current goals can now be found in the care plan section)    Visit Information  Last PT Received On: 11/16/12 Assistance Needed: +1 PT/OT Co-Evaluation/Treatment: Yes History of Present Illness: Mathew Bernard is an 62 y.o. male who was eating lunch at 12 when he noted weakness of his left arm and leg. This concerned him and  he called his wife.  His wife also noted some slurred speech. Patient was brought to [facility] hospital as code stroke.  Patient initial CT head was negative for bleed. Patient was given TPa and transferred to 3100.    Subjective Data   Subjective: "I feel better everyday."   Cognition  Cognition Arousal/Alertness: Awake/alert Behavior During Therapy: WFL for tasks assessed/performed Overall Cognitive Status: Within Functional Limits for tasks assessed    Balance  Balance Balance Assessed: Yes Static Standing Balance Static Standing - Balance Support: No upper extremity supported Static Standing - Level of Assistance: 5: Stand by assistance Standardized Balance Assessment Standardized Balance Assessment: Dynamic Gait Index Dynamic Gait Index Level Surface: Normal Change in Gait Speed: Normal Gait with Horizontal Head Turns: Normal Gait with Vertical Head Turns: Normal Gait and Pivot Turn: Normal Step Over Obstacle: Mild Impairment Step Around Obstacles: Normal Steps: Mild Impairment Total Score: 22  End of Session PT - End of Session Equipment Utilized During Treatment: Gait belt Activity Tolerance: Patient tolerated treatment well Patient left: in chair;with call bell/phone within reach;with family/visitor present Nurse Communication: Mobility status        INGOLD,Kennetta Pavlovic 11/16/2012, 10:56 AM [facility] [phone] (pager)

## 2012-11-16 NOTE — Progress Notes (Signed)
Pt with recommendation for outpatient PT and SLP at discharge. Spoke with pt about choices of agencies.  Pt was already an active patient with [facility] in [place] for PT due to a previous back injury and wishes to do his continued PT there due to proximity to his home.  Unfortunately they do not offer SLP.  Pt is agreeable to doing the SLP at [facility] facility on [address] in [place].  All necessary documentation faxed to both facilities today for therapies to begin this week.  Pt's wife given the address and phone number of the [facility] rehab facility for follow up if she is not called with an appointment in timely manner. Wife stated understanding of all arrangements.

## 2012-11-16 NOTE — Evaluation (Signed)
Speech Language Pathology Evaluation Patient Details Name: Mathew Bernard MRN: [id number] DOB: 1950-12-17 Today's Date: 11/16/2012 Time: 1010-1040 SLP Time Calculation (min): 30 min  Problem List:  Patient Active Problem List   Diagnosis Date Noted  . HNP (herniated nucleus pulposus), lumbar 10/07/2012   Past Medical History:  Past Medical History  Diagnosis Date  . Hypertension   . High cholesterol   . Thyroid disease   . Complication of anesthesia      "depressed after surgery"  . Hypothyroidism   . Depression   . Anxiety   . Sleep apnea     CPAP  . Pneumonia     hx of in 70's  . History of kidney stones   . GERD (gastroesophageal reflux disease)   . Arthritis    Past Surgical History:  Past Surgical History  Procedure Laterality Date  . Back surgery  2013    cervical  . Nerve surgery      helped with headaches  . Kidney stone surgery    . Lumbar laminectomy/decompression microdiscectomy N/A 10/07/2012    Procedure: MICRODISCECTOMY L4-5;  Surgeon: Javier Docker, MD;  Location: [facility];  Service: Orthopedics;  Laterality: N/A;   HPI:  Mathew Bernard is a 62 y.o. male presenting with left hemiparesis, slurred speech. Found to have right basal ganglia infarct. Status post IV t-PA full dose  at 1402. Suspect right brain subcortical infarct . On no antithrombotics prior to admission. Now on no antithrombotics until follow up scan for secondary stroke prevention. Patient with resultant left hemiparesis. Pt had swelling in tongue and mouth after administration of tPA, responded to solumedrol and benadryl.  Pt initally passedRN stroke swallow screen, but wife has noticed cough with solids and pocketing on left. MD confirmed difficulty with family and authorized swallow eval.    Assessment / Plan / Recommendation Clinical Impression  Pt demonstrates mild to moderate cognitive deficits including complex problem solving, reasoning, multistep processing and working memory.  He also demonstrates a mild to moderate dysarthria. Pt benefits from moderate cues to simplify tasks, compensatory strategies provided in written form to pt and wife. These deficits will impact pts independence with ADL's including work. Pt plans to d/c home today, will need f/u with outpatient SLP to address aforementioned deficits.     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  Overall Cognitive Status: Impaired/Different from baseline Arousal/Alertness: Awake/alert Orientation Level: Oriented to person;Oriented to place;Oriented to situation;Disoriented to time Attention: Focused;Sustained;Selective Focused Attention: Appears intact Sustained Attention: Appears intact Selective Attention: Impaired Selective Attention Impairment: Verbal complex;Functional complex Memory: Impaired Memory Impairment: Retrieval deficit;Decreased short term memory Decreased Short Term Memory: Verbal complex Awareness: Impaired Awareness Impairment: Intellectual impairment;Emergent impairment;Anticipatory impairment Problem Solving: Impaired Problem Solving Impairment: Verbal complex;Functional complex Executive Function: Reasoning;Self Monitoring Reasoning: Impaired Reasoning Impairment: Verbal complex;Functional complex Self Monitoring: Impaired Self Monitoring Impairment: Verbal complex;Functional complex Safety/Judgment: Appears intact    Comprehension  Auditory Comprehension Overall Auditory Comprehension: Impaired Yes/No  Questions: Within Functional  Limits Commands: Within Functional Limits Conversation: Complex Interfering Components: Visual impairments;Working memory EffectiveTechniques: Repetition;Extra processing time Reading Comprehension Reading Status: Impaired Word level: Within functional limits Sentence Level: Impaired Paragraph Level: Impaired Functional Environmental (signs, name badge): Within functional limits Interfering Components: Visual scanning;Visual acuity    Expression Verbal Expression Overall Verbal Expression: Appears within functional limits for tasks assessed   Oral / Motor Oral Motor/Sensory Function Overall Oral Motor/Sensory Function: Impaired Labial ROM: Reduced left Labial Symmetry: Abnormal symmetry left Labial Strength: Reduced Labial Sensation: Within Functional Limits Lingual ROM: Reduced left Lingual Symmetry: Abnormal symmetry left Lingual Strength: Reduced Lingual Sensation: Reduced Facial ROM: Within Functional Limits Facial Symmetry: Within Functional Limits Facial Strength: Within Functional Limits Motor Speech Overall Motor Speech: Impaired Respiration: Within functional limits Phonation: Normal Resonance: Within functional limits Articulation: Impaired Level of Impairment: Word Intelligibility: Intelligibility reduced Word: 75-100% accurate Phrase: 75-100% accurate Sentence: 75-100% accurate Conversation: 75-100% accurate Motor Planning: Witnin functional limits   GO    [employer], MA CCC-SLP [phone]  Claudine Mouton 11/16/2012, 1:32 PM

## 2012-11-16 NOTE — Evaluation (Signed)
Clinical/Bedside Swallow Evaluation Patient Details  Name: Mathew Bernard MRN: [id number] Date of Birth: November 20, 1950  Today's Date: 11/16/2012 Time: 1010-1040 SLP Time Calculation (min): 30 min  Past Medical History:  Past Medical History  Diagnosis Date  . Hypertension   . High cholesterol   . Thyroid disease   . Complication of anesthesia      "depressed after surgery"  . Hypothyroidism   . Depression   . Anxiety   . Sleep apnea     CPAP  . Pneumonia     hx of in 70's  . History of kidney stones   . GERD (gastroesophageal reflux disease)   . Arthritis    Past Surgical History:  Past Surgical History  Procedure Laterality Date  . Back surgery  2013    cervical  . Nerve surgery      helped with headaches  . Kidney stone surgery    . Lumbar laminectomy/decompression microdiscectomy N/A 10/07/2012    Procedure: MICRODISCECTOMY L4-5;  Surgeon: Mathew Docker, MD;  Location: [facility];  Service: Orthopedics;  Laterality: N/A;   HPI:  Mr. Mathew Bernard is a 62 y.o. male presenting with left hemiparesis, slurred speech. Found to have right basal ganglia infarct. Status post IV t-PA full dose  at 1402. Suspect right brain subcortical infarct . On no antithrombotics prior to admission. Now on no antithrombotics until follow up scan for secondary stroke prevention. Patient with resultant left hemiparesis. Pt had swelling in tongue and mouth after administration of tPA, responded to solumedrol and benadryl.  Pt initally passedRN stroke swallow screen, but wife has noticed cough with solids and pocketing on left. MD confirmed difficulty with family and authorized swallow eval.    Assessment / Plan / Recommendation Clinical Impression  Pt's wife described pt coughing during meals and pocketing of food on the left side. Pt demonstrated an occasional delayed throat clear following thin liquid trials. Pt did not demonstrate any overt s/s of aspiration following applesauce or cracker;  however he did have some oral residue, primarily on the left side of the tongue after swallowing the cracker. Pt presents with mild-moderate risk of aspiration. Rx for pt to have supervision during meals, small bites and sips, and to be cued to do occasional lingual sweep to clear material from left buccal cavity. Also recommended for pt to avoid distractions during meals due to mild cognitive deficits.    Aspiration Risk  Moderate    Diet Recommendation Regular;Thin liquid   Liquid Administration via: Cup;Straw Medication Administration: Whole meds with liquid Supervision: Patient able to self feed;Intermittent supervision to cue for compensatory strategies Compensations: Slow rate;Small sips/bites;Check for pocketing Postural Changes and/or Swallow Maneuvers: Seated upright 90 degrees;Upright 30-60 min after meal    Other  Recommendations Oral Care Recommendations: Other (Comment) (Oral care after meals to check for residue/ pocketing)   Follow Up Recommendations  Outpatient SLP (for dysarthria/ cognition)    Frequency and Duration        Pertinent Vitals/Pain N/A    SLP Swallow Goals     Swallow Study Prior Functional Status  Cognitive/Linguistic Baseline: Within functional limits Type of Home: House  Lives With: Spouse Available Help at Discharge: Family;Available 24 hours/day Vocation: On disability (short term, plans to return to work, maintenance at school)    General HPI: Mr. Mathew Bernard is a 62 y.o. male presenting with left hemiparesis, slurred speech. Found to have right basal ganglia infarct. Status post IV t-PA full dose  at 1402. Suspect right brain subcortical infarct . On no antithrombotics prior to admission. Now on no antithrombotics until follow up scan for secondary stroke prevention. Patient with resultant left hemiparesis. Pt had swelling in tongue and mouth after administration of tPA, responded to solumedrol and benadryl.  Pt initally passedRN stroke  swallow screen, but wife has noticed cough with solids and pocketing on left. MD confirmed difficulty with family and authorized swallow eval.  Type of Study: Bedside swallow evaluation Diet Prior to this Study: Regular;Thin liquids Temperature Spikes Noted: No Respiratory Status: Room air History of Recent Intubation: No Behavior/Cognition: Alert;Cooperative;Pleasant mood;Hard of hearing Oral Cavity - Dentition: Adequate natural dentition Self-Feeding Abilities: Able to feed self Patient Positioning: Upright in chair Baseline Vocal Quality: Clear Volitional Cough: Strong Volitional Swallow: Able to elicit    Oral/Motor/Sensory Function Overall Oral Motor/Sensory Function: Impaired Labial ROM: Reduced left Labial Symmetry: Abnormal symmetry left Labial Strength: Reduced Labial Sensation: Within Functional Limits Lingual ROM: Reduced left Lingual Symmetry: Abnormal symmetry left Lingual Strength: Reduced Lingual Sensation: Reduced Facial ROM: Within Functional Limits Facial Symmetry: Within Functional Limits Facial Strength: Within Functional Limits   Ice Chips Ice chips: Not tested   Thin Liquid Thin Liquid: Impaired Presentation: Cup;Self Fed Pharyngeal  Phase Impairments: Throat Clearing - Delayed    Nectar Thick Nectar Thick Liquid: Not tested   Honey Thick Honey Thick Liquid: Not tested   Puree Puree: Within functional limits   Solid   GO    Solid: Within functional limits       Mathew Bernard, Mathew Bernard 11/16/2012,12:42 PM

## 2012-11-16 NOTE — Progress Notes (Signed)
Stroke Discharge Summary  Patient ID: Mathew Bernard   MRN: [id number]      DOB: [birth year]  Date of Admission: 11/14/2012 Date of Discharge: 11/16/2012  Attending Physician:  Darcella Cheshire, MD, Stroke MD  Consulting Physician(s):     None  Patient's PCP:  Estanislado Pandy, MD  Discharge Diagnoses:  Principal Problem:   Acute ischemic left MCA stroke Active Problems:   OSA (obstructive sleep apnea)   Essential hypertension, benign   Other and unspecified hyperlipidemia  BMI: Body mass index is 32.09 kg/(m^2).  Past Medical History  Diagnosis Date  . Hypertension   . High cholesterol   . Thyroid disease   . Complication of anesthesia      "depressed after surgery"  . Hypothyroidism   . Depression   . Anxiety   . Sleep apnea     CPAP  . Pneumonia     hx of in 70's  . History of kidney stones   . GERD (gastroesophageal reflux disease)   . Arthritis    Past Surgical History  Procedure Laterality Date  . Back surgery  2013    cervical  . Nerve surgery      helped with headaches  . Kidney stone surgery    . Lumbar laminectomy/decompression microdiscectomy N/A 10/07/2012    Procedure: MICRODISCECTOMY L4-5;  Surgeon: Javier Docker, MD;  Location: [facility];  Service: Orthopedics;  Laterality: N/A;      Medication List    STOP taking these medications       amLODipine-benazepril 5-10 MG per capsule  Commonly known as:  LOTREL     cyanocobalamin 500 MCG tablet     fluticasone 50 MCG/ACT nasal spray  Commonly known as:  FLONASE     ketoprofen 75 MG capsule  Commonly known as:  ORUDIS     levothyroxine 50 MCG tablet  Commonly known as:  SYNTHROID, LEVOTHROID     OVER THE COUNTER MEDICATION      TAKE these medications       albuterol 108 (90 BASE) MCG/ACT inhaler  Commonly known as:  PROVENTIL HFA;VENTOLIN HFA  Inhale 2 puffs into the lungs every 6 (six) hours as needed for wheezing.     aspirin 325 MG EC tablet  Take 1 tablet (325 mg total) by  mouth daily.     cetirizine 10 MG tablet  Commonly known as:  ZYRTEC  Take 10 mg by mouth daily as needed for allergies.     Fish Oil 1200 MG Caps  Take 2,400 mg by mouth 2 (two) times daily.     ofloxacin 0.3 % ophthalmic solution  Commonly known as:  OCUFLOX  Place 1 drop into the right eye 4 (four) times daily.     omeprazole 20 MG capsule  Commonly known as:  PRILOSEC  Take 20 mg by mouth 2 (two) times daily.     prednisoLONE acetate 1 % ophthalmic suspension  Commonly known as:  PRED FORTE  Place 1 drop into the right eye 2 (two) times daily.     sertraline 100 MG tablet  Commonly known as:  ZOLOFT  Take 150 mg by mouth at bedtime.     simvastatin 40 MG tablet  Commonly known as:  ZOCOR  Take 1 tablet (40 mg total) by mouth daily at 6 PM.        LABORATORY STUDIES CBC    Component Value Date/Time   WBC 7.6 11/14/2012 1318   RBC  4.98 11/14/2012 1318   HGB 14.3 11/14/2012 1327   HCT 42.0 11/14/2012 1327   PLT 155 11/14/2012 1318   MCV 81.5 11/14/2012 1318   MCH 26.5 11/14/2012 1318   MCHC 32.5 11/14/2012 1318   RDW 15.7* 11/14/2012 1318   LYMPHSABS 1.5 11/14/2012 1318   MONOABS 0.6 11/14/2012 1318   EOSABS 0.2 11/14/2012 1318   BASOSABS 0.0 11/14/2012 1318   CMP    Component Value Date/Time   NA 139 11/14/2012 1327   K 4.2 11/14/2012 1327   CL 105 11/14/2012 1327   CO2 26 11/14/2012 1318   GLUCOSE 119* 11/14/2012 1327   BUN 19 11/14/2012 1327   CREATININE 1.10 11/14/2012 1327   CALCIUM 9.0 11/14/2012 1318   PROT 6.8 11/14/2012 1318   ALBUMIN 3.5 11/14/2012 1318   AST 22 11/14/2012 1318   ALT 28 11/14/2012 1318   ALKPHOS 87 11/14/2012 1318   BILITOT 0.2* 11/14/2012 1318   GFRNONAA 66* 11/14/2012 1318   GFRAA 76* 11/14/2012 1318   COAGS Lab Results  Component Value Date   INR 0.91 11/14/2012   Lipid Panel    Component Value Date/Time   CHOL 271* 11/15/2012 0455   TRIG 128 11/15/2012 0455   HDL 39* 11/15/2012 0455   CHOLHDL 6.9 11/15/2012 0455   VLDL 26 11/15/2012 0455    LDLCALC 206* 11/15/2012 0455   HgbA1C  Lab Results  Component Value Date   HGBA1C 6.6* 11/15/2012   Cardiac Panel (last 3 results)  Recent Labs  11/14/12 1318  TROPONINI <0.30   Urinalysis    Component Value Date/Time   COLORURINE YELLOW 11/14/2012 1508   APPEARANCEUR CLEAR 11/14/2012 1508   LABSPEC 1.013 11/14/2012 1508   PHURINE 6.0 11/14/2012 1508   GLUCOSEU NEGATIVE 11/14/2012 1508   HGBUR NEGATIVE 11/14/2012 1508   BILIRUBINUR NEGATIVE 11/14/2012 1508   KETONESUR NEGATIVE 11/14/2012 1508   PROTEINUR NEGATIVE 11/14/2012 1508   UROBILINOGEN 0.2 11/14/2012 1508   NITRITE NEGATIVE 11/14/2012 1508   LEUKOCYTESUR NEGATIVE 11/14/2012 1508   Urine Drug Screen    Component Value Date/Time   LABOPIA NONE DETECTED 11/14/2012 1508   COCAINSCRNUR NONE DETECTED 11/14/2012 1508   LABBENZ NONE DETECTED 11/14/2012 1508   AMPHETMU NONE DETECTED 11/14/2012 1508   THCU NONE DETECTED 11/14/2012 1508   LABBARB NONE DETECTED 11/14/2012 1508    Alcohol Level    Component Value Date/Time   ETH <11 11/14/2012 1318     SIGNIFICANT DIAGNOSTIC STUDIES Dg Chest 2 View  11/14/2012 Shallow inspiration. No evidence of active pulmonary disease.  Ct Head Wo Contrast  11/14/2012 High density in the left middle cerebral artery was probably present on the prior examination 07/07/2012. This may represent partial volume averaging of a atherosclerosis or chronic hyperdense left MCA sign. There are no secondary signs of cerebral ischemia. No definite acute intracranial abnormality. Old small lacunar infarcts are unchanged.  MRI of the brain  MRA of the brain  2D Echocardiogram Left ventricle: The cavity size was normal. Systolicfunction was normal. The estimated ejection fraction was in the range of 55% to 60%. Wall motion was normal  Carotid Doppler Bilateral: Less than 39% ICA stenosis. Vertebral artery flow is antegrade     History of Present Illness    Mathew Bernard is an 62 y.o. male who was eating lunch at  12 when he noted weakness of his left arm and leg. This concerned him and he called his wife. His wife also noted some slurred speech. Patient  was brought to [facility] hospital as code stroke. Patient initial CT head was negative for bleed. Patient was given TPa and transferred to 3100.  LSN: 1200  tPA Given: Yes  He was admitted to the neuro ICU for further evaluation and treatment. After tPA he did have some lip and tongue swelling which responded to solumedrol and benadryl.   Hospital Course  Mr. Mathew Bernard is a 62 y.o. male presenting with left hemiparesis, slurred speech. Status post IV t-PA full dose at 1402. Suspect right brain subcortical infarct . On no antithrombotics prior to admission. Now on no antithrombotics until follow up scan for secondary stroke prevention. Patient with resultant left hemiparesis. Work up underway.  Hypertension  Hyperlipidemia, LDL 206  Sleep apnea  hgb Z6X, 6.6  Increase zocor to 40mg  daily plus coenzyme Q10  Long term risk factor modification  Allergy to tPA, would have to be treated if needed to be used in the future. Recommend using ID, ie, med alert bracelet. Daughter later came in and stated she was concerned about depression in patient and felt he would be best treated by mental health professional. She has contact with Dr. Lubertha Basque and will acquire an appt. She also had concerns regarding his smoking. We discussed smoking Cessation and I discussed smoking cessation and options including nicoderm patches and dosing 34mcg/daily for 2 weeks, then decrease to daily for 2 weeks, then d/c. In addition, with anti-depressant, this too will assist with attempt.  Follow up with Dr. Pearlean Brownie in 2 months.  Patient with continued stroke symptoms of mobility challenges. Physical therapy, occupational therapy and speech therapy evaluated patient. They recommend outpatient therapies.  Discharge Exam  Blood pressure 136/93, pulse 54, temperature 98 F (36.7 C),  temperature source Oral, resp. rate 20, height 5\' 8"  (1.727 m), weight 95.7 kg (210 lb 15.7 oz), SpO2 97.00%.   Physical Exam  Neurologic Examination:  Mental Status:  Alert, oriented, thought content appropriate. Speech fluent without evidence of aphasia. Able to follow 3 step commands without difficulty.  Cranial Nerves:  II: Visual fields grossly normal, pupils equal, round.  III,IV, VI: ptosis not present, extra-ocular motions intact bilaterally  V,VII: face mild asymmetric left facial droop, facial light touch sensation normal bilaterally  VIII: hearing normal bilaterally  IX,X: gag reflex present  XI: bilateral shoulder shrug  XII: midline tongue extension  Motor:  Right : Upper extremity 5/5 Left: Upper extremity 4/5  Lower extremity 5/5 Lower extremity 4/5  --drift on left arm and leg  Tone and bulk:normal tone throughout; no atrophy noted  Sensory: Pinprick and light touch intact throughout, bilaterally  Plantars:  Right: downgoing Left: downgoing  Cerebellar:  normal finger-to-nose, normal heel-to-shin test  CV: pulses palpable throughout    Discharge Diet   Cardiac thin liquids  Discharge Plan    Disposition:  home   aspirin 325 mg orally every Mathew for secondary stroke prevention.  Ongoing risk factor control by Primary Care Physician. Risk factor recommendations:  Hypertension target range 130-140/70-80 Lipid range - LDL < 100 and checked every 6 months, fasting Diabetes - HgB A1C <7   Follow-up SASSER,PAUL W, MD in 1 month.  Mental Health follow up as discussed with daughter.  Follow-up with Dr. Delia Heady, Stroke Clinic in 2 months.  45 minutes were spent preparing discharge.  Signed  Gwendolyn Lima. Manson Passey, [facility], MBA, MHA [facility] Pager: [phone] 11/16/2012 2:14 PM   I have personally examined this patient, reviewed pertinent  data and developed the plan of care. I agree with above.  Delia Heady, MD

## 2012-11-16 NOTE — Progress Notes (Signed)
Occupational Therapy Treatment Patient Details Name: Mathew Bernard MRN: [id number] DOB: [birth year] Today's Date: 11/16/2012 Time: 4098-1191 OT Time Calculation (min): 26 min  OT Assessment / Plan / Recommendation  History of present illness Mathew Bernard is an 62 y.o. male who was eating lunch at 12 when he noted weakness of his left arm and leg. This concerned him and he called his wife.  His wife also noted some slurred speech. Patient was brought to [facility] hospital as code stroke.  Patient initial CT head was negative for bleed. Patient was given TPa and transferred to 3100.       OT comments  Pt making good progress toward goals.  Anticipate possible d/c home today with family.   Follow Up Recommendations  No OT follow up    Barriers to Discharge       Equipment Recommendations  Tub/shower seat (pt/family to get on their own)    Recommendations for Other Services    Frequency Min 2X/week   Progress towards OT Goals Progress towards OT goals: Progressing toward goals  Plan Discharge plan remains appropriate    Precautions / Restrictions Precautions Precautions: Fall Restrictions Weight Bearing Restrictions: No   Pertinent Vitals/Pain See vitals    ADL  Grooming: Performed;Wash/dry hands;Supervision/safety Where Assessed - Grooming: Unsupported standing Upper Body Dressing: Performed;Set up Where Assessed - Upper Body Dressing: Unsupported sitting Toilet Transfer: Performed;Min guard Toilet Transfer Method: Sit to Barista: Regular height toilet Toileting - Clothing Manipulation and Hygiene: Performed;Supervision/safety Where Assessed - Engineer, mining and Hygiene: Sit to stand from 3-in-1 or toilet Tub/Shower Transfer: Simulated;Min guard Tub/Shower Transfer Method: Science writer: Walk in shower Equipment Used: Gait belt Transfers/Ambulation Related to ADLs: min guard for safety ambulating in  room/hall.   ADL Comments: Pt with tendency to veer left when ambulating around objects in hall/room.  Cued pt to scan environment thoroughly when ambulating, especially to left side. After cued to scan, pt veering less to left and not bumping into door,desk.  Educated pt and wife on having someone walk on pt's left side when walking in community and to MD appointments.  Wife states that daughter is going to get shower chair for pt.  Pt demo'ing improved strength in LUE (4/5 throughout).  Educated pt on repetitive use of LUE during functional ADL tasks in order to improved gross/fine motor control.     OT Diagnosis:    OT Problem List:   OT Treatment Interventions:     OT Goals(current goals can now be found in the care plan section) Acute Rehab OT Goals Patient Stated Goal: To go home OT Goal Formulation: With patient Time For Goal Achievement: 11/22/12 Potential to Achieve Goals: Good ADL Goals Pt Will Perform Grooming: with supervision;standing Pt Will Transfer to Toilet: with supervision;ambulating;regular height toilet Pt Will Perform Tub/Shower Transfer: Shower transfer;with supervision;ambulating;shower seat Pt/caregiver will Perform Home Exercise Program: For increased strengthening;Independently Additional ADL Goal #1: Pt will be able to go around room and gather items asked of him at a S level without AD  Visit Information  Last OT Received On: 11/16/12 Assistance Needed: +1 History of Present Illness: Mathew Bernard is an 62 y.o. male who was eating lunch at 12 when he noted weakness of his left arm and leg. This concerned him and he called his wife.  His wife also noted some slurred speech. Patient was brought to [facility] hospital as code stroke.  Patient initial CT head was negative for  bleed. Patient was given TPa and transferred to 3100.    Subjective Data      Prior Functioning  Home Living Available Help at Discharge: Family;Available 24 hours/day Type of Home: House   Lives With: Spouse    Cognition  Cognition Arousal/Alertness: Awake/alert Behavior During Therapy: WFL for tasks assessed/performed Overall Cognitive Status: Within Functional Limits for tasks assessed    Mobility  Bed Mobility Bed Mobility: Not assessed  Transfers Transfers: Sit to Stand;Stand to Sit Sit to Stand: 5: Supervision;From bed;From chair/3-in-1;From toilet;With upper extremity assist Stand to Sit: 5: Supervision;To chair/3-in-1;To toilet;With upper extremity assist    Exercises     Balance Balance Balance Assessed: Yes Static Standing Balance Static Standing - Balance Support: No upper extremity supported Static Standing - Level of Assistance: 5: Stand by assistance    End of Session OT - End of Session Equipment Utilized During Treatment: Gait belt Activity Tolerance: Patient tolerated treatment well Patient left: in chair;with call bell/phone within reach;with family/visitor present Nurse Communication: Mobility status  GO    11/16/2012 Cipriano Mile OTR/L Pager [phone] Office [phone]  Cipriano Mile 11/16/2012, 12:32 PM

## 2012-11-17 ENCOUNTER — Telehealth: Payer: Self-pay | Admitting: Neurology

## 2012-11-17 NOTE — Telephone Encounter (Signed)
Patient's spouse called stating she didn't get chance to speak with physician at hospital on yesterday concerning her husband's MRI results and she would to speak with physician concerning those results and she would like a explanation of why her husband has to stop taking Synthroid.

## 2012-11-18 ENCOUNTER — Ambulatory Visit: Payer: BC Managed Care – PPO | Attending: Neurology

## 2012-11-18 DIAGNOSIS — R4701 Aphasia: Secondary | ICD-10-CM | POA: Insufficient documentation

## 2012-11-18 DIAGNOSIS — R41841 Cognitive communication deficit: Secondary | ICD-10-CM | POA: Insufficient documentation

## 2012-11-18 DIAGNOSIS — R471 Dysarthria and anarthria: Secondary | ICD-10-CM | POA: Insufficient documentation

## 2012-11-18 NOTE — Discharge Summary (Signed)
Patient ID: Mathew Bernard  MRN: [id number]  DOB: 18-Dec-1950  Date of Admission: 11/14/2012  Date of Discharge: 11/16/2012  Attending Physician: Darcella Cheshire, MD, Stroke MD  Consulting Physician(s): None  Patient's PCP: Estanislado Pandy, MD  Discharge Diagnoses:  Principal Problem:  Acute ischemic left MCA stroke  Active Problems:  OSA (obstructive sleep apnea)  Essential hypertension, benign  Other and unspecified hyperlipidemia  BMI: Body mass index is 32.09 kg/(m^2).  Past Medical History   Diagnosis  Date   .  Hypertension    .  High cholesterol    .  Thyroid disease    .  Complication of anesthesia      "depressed after surgery"   .  Hypothyroidism    .  Depression    .  Anxiety    .  Sleep apnea      CPAP   .  Pneumonia      hx of in 70's   .  History of kidney stones    .  GERD (gastroesophageal reflux disease)    .  Arthritis     Past Surgical History   Procedure  Laterality  Date   .  Back surgery   2013     cervical   .  Nerve surgery       helped with headaches   .  Kidney stone surgery     .  Lumbar laminectomy/decompression microdiscectomy  N/A  10/07/2012     Procedure: MICRODISCECTOMY L4-5; Surgeon: Javier Docker, MD; Location: [facility]; Service: Orthopedics; Laterality: N/A;      Medication List     STOP taking these medications       amLODipine-benazepril 5-10 MG per capsule    Commonly known as: LOTREL    cyanocobalamin 500 MCG tablet    fluticasone 50 MCG/ACT nasal spray    Commonly known as: FLONASE    ketoprofen 75 MG capsule    Commonly known as: ORUDIS    levothyroxine 50 MCG tablet    Commonly known as: SYNTHROID, LEVOTHROID    OVER THE COUNTER MEDICATION     TAKE these medications       albuterol 108 (90 BASE) MCG/ACT inhaler    Commonly known as: PROVENTIL HFA;VENTOLIN HFA    Inhale 2 puffs into the lungs every 6 (six) hours as needed for wheezing.    aspirin 325 MG EC tablet    Take 1 tablet (325 mg total) by mouth daily.     cetirizine 10 MG tablet    Commonly known as: ZYRTEC    Take 10 mg by mouth daily as needed for allergies.    Fish Oil 1200 MG Caps    Take 2,400 mg by mouth 2 (two) times daily.    ofloxacin 0.3 % ophthalmic solution    Commonly known as: OCUFLOX    Place 1 drop into the right eye 4 (four) times daily.    omeprazole 20 MG capsule    Commonly known as: PRILOSEC    Take 20 mg by mouth 2 (two) times daily.    prednisoLONE acetate 1 % ophthalmic suspension    Commonly known as: PRED FORTE    Place 1 drop into the right eye 2 (two) times daily.    sertraline 100 MG tablet    Commonly known as: ZOLOFT    Take 150 mg by mouth at bedtime.    simvastatin 40 MG tablet    Commonly known as: ZOCOR  Take 1 tablet (40 mg total) by mouth daily at 6 PM.      LABORATORY STUDIES  CBC    Component  Value  Date/Time    WBC  7.6  11/14/2012 1318    RBC  4.98  11/14/2012 1318    HGB  14.3  11/14/2012 1327    HCT  42.0  11/14/2012 1327    PLT  155  11/14/2012 1318    MCV  81.5  11/14/2012 1318    MCH  26.5  11/14/2012 1318    MCHC  32.5  11/14/2012 1318    RDW  15.7*  11/14/2012 1318    LYMPHSABS  1.5  11/14/2012 1318    MONOABS  0.6  11/14/2012 1318    EOSABS  0.2  11/14/2012 1318    BASOSABS  0.0  11/14/2012 1318    CMP    Component  Value  Date/Time    NA  139  11/14/2012 1327    K  4.2  11/14/2012 1327    CL  105  11/14/2012 1327    CO2  26  11/14/2012 1318    GLUCOSE  119*  11/14/2012 1327    BUN  19  11/14/2012 1327    CREATININE  1.10  11/14/2012 1327    CALCIUM  9.0  11/14/2012 1318    PROT  6.8  11/14/2012 1318    ALBUMIN  3.5  11/14/2012 1318    AST  22  11/14/2012 1318    ALT  28  11/14/2012 1318    ALKPHOS  87  11/14/2012 1318    BILITOT  0.2*  11/14/2012 1318    GFRNONAA  66*  11/14/2012 1318    GFRAA  76*  11/14/2012 1318    COAGS  Lab Results   Component  Value  Date    INR  0.91  11/14/2012    Lipid Panel    Component  Value  Date/Time    CHOL  271*  11/15/2012 0455    TRIG  128   11/15/2012 0455    HDL  39*  11/15/2012 0455    CHOLHDL  6.9  11/15/2012 0455    VLDL  26  11/15/2012 0455    LDLCALC  206*  11/15/2012 0455    HgbA1C  Lab Results   Component  Value  Date    HGBA1C  6.6*  11/15/2012    Cardiac Panel (last 3 results)  Recent Labs   11/14/12 1318   TROPONINI  <0.30    Urinalysis    Component  Value  Date/Time    COLORURINE  YELLOW  11/14/2012 1508    APPEARANCEUR  CLEAR  11/14/2012 1508    LABSPEC  1.013  11/14/2012 1508    PHURINE  6.0  11/14/2012 1508    GLUCOSEU  NEGATIVE  11/14/2012 1508    HGBUR  NEGATIVE  11/14/2012 1508    BILIRUBINUR  NEGATIVE  11/14/2012 1508    KETONESUR  NEGATIVE  11/14/2012 1508    PROTEINUR  NEGATIVE  11/14/2012 1508    UROBILINOGEN  0.2  11/14/2012 1508    NITRITE  NEGATIVE  11/14/2012 1508    LEUKOCYTESUR  NEGATIVE  11/14/2012 1508    Urine Drug Screen   Component  Value  Date/Time    LABOPIA  NONE DETECTED  11/14/2012 1508    COCAINSCRNUR  NONE DETECTED  11/14/2012 1508    LABBENZ  NONE DETECTED  11/14/2012 1508  AMPHETMU  NONE DETECTED  11/14/2012 1508    THCU  NONE DETECTED  11/14/2012 1508    LABBARB  NONE DETECTED  11/14/2012 1508    Alcohol Level    Component  Value  Date/Time    [facility]  <11  11/14/2012 1318    SIGNIFICANT DIAGNOSTIC STUDIES  Dg Chest 2 View  11/14/2012 Shallow inspiration. No evidence of active pulmonary disease.  Ct Head Wo Contrast  11/14/2012 High density in the left middle cerebral artery was probably present on the prior examination 07/07/2012. This may represent partial volume averaging of a atherosclerosis or chronic hyperdense left MCA sign. There are no secondary signs of cerebral ischemia. No definite acute intracranial abnormality. Old small lacunar infarcts are unchanged.  MRI of the brain  MRA of the brain  2D Echocardiogram Left ventricle: The cavity size was normal. Systolicfunction was normal. The estimated ejection fraction was in the range of 55% to 60%. Wall motion was normal   Carotid Doppler Bilateral: Less than 39% ICA stenosis. Vertebral artery flow is antegrade  History of Present Illness  Mathew Bernard is an 62 y.o. male who was eating lunch at 12 when he noted weakness of his left arm and leg. This concerned him and he called his wife. His wife also noted some slurred speech. Patient was brought to [facility] hospital as code stroke. Patient initial CT head was negative for bleed. Patient was given TPa and transferred to 3100.  LSN: 1200  tPA Given: Yes  He was admitted to the neuro ICU for further evaluation and treatment. After tPA he did have some lip and tongue swelling which responded to solumedrol and benadryl.  Hospital Course  Mathew Bernard is a 62 y.o. male presenting with left hemiparesis, slurred speech. Status post IV t-PA full dose at 1402. Suspect right brain subcortical infarct . On no antithrombotics prior to admission. Now on no antithrombotics until follow up scan for secondary stroke prevention. Patient with resultant left hemiparesis. Work up underway.  Hypertension  Hyperlipidemia, LDL 206  Sleep apnea  hgb O1H, 6.6  Increase zocor to 40mg  daily plus coenzyme Q10  Long term risk factor modification  Allergy to tPA, would have to be treated if needed to be used in the future. Recommend using ID, ie, med alert bracelet.  Daughter later came in and stated she was concerned about depression in patient and felt he would be best treated by mental health professional. She has contact with Dr. Lubertha Basque and will acquire an appt. She also had concerns regarding his smoking. We discussed smoking Cessation and I discussed smoking cessation and options including nicoderm patches and dosing 72mcg/daily for 2 weeks, then decrease to daily for 2 weeks, then d/c. In addition, with anti-depressant, this too will assist with attempt.  Follow up with Dr. Pearlean Brownie in 2 months. Patient with continued stroke symptoms of mobility challenges. Physical therapy,  occupational therapy and speech therapy evaluated patient. They recommend outpatient therapies.  Discharge Exam Blood pressure 136/93, pulse 54, temperature 98 F (36.7 C), temperature source Oral, resp. rate 20, height 5\' 8"  (1.727 m), weight 95.7 kg (210 lb 15.7 oz), SpO2 97.00%.  Physical Exam  Neurologic Examination:  Mental Status:  Alert, oriented, thought content appropriate. Speech fluent without evidence of aphasia. Able to follow 3 step commands without difficulty.  Cranial Nerves:  II: Visual fields grossly normal, pupils equal, round.  III,IV, VI: ptosis not present, extra-ocular motions intact bilaterally  V,VII: face  mild asymmetric left facial droop, facial light touch sensation normal bilaterally  VIII: hearing normal bilaterally  IX,X: gag reflex present  XI: bilateral shoulder shrug  XII: midline tongue extension  Motor:  Right : Upper extremity 5/5 Left: Upper extremity 4/5  Lower extremity 5/5 Lower extremity 4/5  --drift on left arm and leg  Tone and bulk:normal tone throughout; no atrophy noted  Sensory: Pinprick and light touch intact throughout, bilaterally  Plantars:  Right: downgoing Left: downgoing  Cerebellar:  normal finger-to-nose, normal heel-to-shin test  CV: pulses palpable throughout  Discharge Diet Cardiac thin liquids  Discharge Plan  Disposition: home  aspirin 325 mg orally every day for secondary stroke prevention.  Ongoing risk factor control by Primary Care Physician. Risk factor recommendations: Hypertension target range 130-140/70-80  Lipid range - LDL < 100 and checked every 6 months, fasting  Diabetes - HgB A1C <7  Follow-up SASSER,PAUL W, MD in 1 month.  Mental Health follow up as discussed with daughter.  Follow-up with Dr. Delia Heady, Stroke Clinic in 2 months. 45 minutes were spent preparing discharge.  Signed  Gwendolyn Lima. Manson Passey, [facility], MBA, MHA  [facility]  Pager: [phone]  11/16/2012 2:14 PM  I have personally  examined this patient, reviewed pertinent data and developed the plan of care. I agree with above.   Delia Heady, MD

## 2012-11-18 NOTE — Telephone Encounter (Signed)
Wife called back and asking about synthroid restart and lotrel.   She had spoken to pcp, did not know why stopped.  She said they would probably restart this.  Also MRI results.  Consulted Dr. Pearlean Brownie and relayed to call Larita Fife at [facility] [phone] re: questions.   I called Larita Fife and she will call wife at home #, which I gave re: questions.

## 2012-11-24 ENCOUNTER — Ambulatory Visit (HOSPITAL_COMMUNITY)
Admission: RE | Admit: 2012-11-24 | Discharge: 2012-11-24 | Disposition: A | Discharge status: Home / Self Care | Payer: BC Managed Care – PPO | Source: Ambulatory Visit | Attending: Neurology | Admitting: Neurology

## 2012-11-24 DIAGNOSIS — IMO0001 Reserved for inherently not codable concepts without codable children: Secondary | ICD-10-CM | POA: Insufficient documentation

## 2012-11-24 DIAGNOSIS — I69922 Dysarthria following unspecified cerebrovascular disease: Secondary | ICD-10-CM | POA: Insufficient documentation

## 2012-11-24 DIAGNOSIS — I69928 Other speech and language deficits following unspecified cerebrovascular disease: Secondary | ICD-10-CM | POA: Insufficient documentation

## 2012-11-24 NOTE — Evaluation (Signed)
Speech Language Pathology Evaluation Patient Details  Name: Mathew Bernard MRN: [id number] Date of Birth: 05/23/1950  Today's Date: 11/24/2012 Time: 1530-1630 SLP Time Calculation (min): 60 min  Authorization:    Authorization Time Period:    Authorization Visit#: 1 of 1  Past Medical History:  Past Medical History  Diagnosis Date  . Hypertension   . High cholesterol   . Thyroid disease   . Complication of anesthesia      "depressed after surgery"  . Hypothyroidism   . Depression   . Anxiety   . Sleep apnea     CPAP  . Pneumonia     hx of in 70's  . History of kidney stones   . GERD (gastroesophageal reflux disease)   . Arthritis    Past Surgical History:  Past Surgical History  Procedure Laterality Date  . Back surgery  2013    cervical  . Nerve surgery      helped with headaches  . Kidney stone surgery    . Lumbar laminectomy/decompression microdiscectomy N/A 10/07/2012    Procedure: MICRODISCECTOMY L4-5;  Surgeon: Javier Docker, MD;  Location: [facility];  Service: Orthopedics;  Laterality: N/A;   HPI:  Symptoms/Limitations Symptoms: left MCA CVA on 11/14/12   exhibting min dysarthria and high level coggnitive deficits Pain Assessment Currently in Pain?: No/denies  Prior Functional Status  Cognitive/Linguistic Baseline: Within functional limits Type of Home: House  Lives With: Spouse Available Help at Discharge: Family;Available 24 hours/day Vocation: Full time employment  Balance Screening Balance Screen Has the patient fallen in the past 6 months: No Has the patient had a decrease in activity level because of a fear of falling? : No Is the patient reluctant to leave their home because of a fear of falling? : No  Cognition  Overall Cognitive Status: Impaired/Different from baseline Arousal/Alertness: Awake/alert Orientation Level: Oriented X4 Attention: Focused Focused Attention: Appears intact Sustained Attention: Appears intact Selective  Attention: Impaired Selective Attention Impairment: Verbal complex;Functional complex Memory: Impaired Memory Impairment: Decreased short term memory;Retrieval deficit Decreased Short Term Memory: Functional complex Awareness: Appears intact Problem Solving: Impaired Problem Solving Impairment: Verbal complex;Functional complex Reasoning: Impaired Reasoning Impairment: Functional complex Self Monitoring: Appears intact Safety/Judgment: Appears intact  Comprehension  Auditory Comprehension Overall Auditory Comprehension: Appears within functional limits for tasks assessed Yes/No Questions: Within Functional Limits Commands: Within Functional Limits Conversation: Complex Visual Recognition/Discrimination Discrimination: Within Function Limits Reading Comprehension Reading Status: Impaired Word level: Within functional limits Sentence Level: Impaired Paragraph Level: Impaired Functional Environmental (signs, name badge): Within functional limits Interfering Components: Visual acuity Effective Techniques: Large print;Eye glasses  Expression  Expression Primary Mode of Expression: Verbal Verbal Expression Overall Verbal Expression: Appears within functional limits for tasks assessed Initiation: No impairment Level of Generative/Spontaneous Verbalization: Conversation Repetition: No impairment Naming: No impairment Pragmatics: No impairment Written Expression Dominant Hand: Right Written Expression: Within Functional Limits  Oral/Motor  Oral Motor/Sensory Function Labial ROM: Reduced left Labial Symmetry: Abnormal symmetry left Labial Strength: Reduced Labial Sensation: Reduced Lingual ROM: Reduced left Lingual Symmetry: Abnormal symmetry left Lingual Strength: Reduced Lingual Sensation: Reduced Facial ROM: Reduced left Facial Symmetry: Left droop Facial Strength: Within Functional Limits Facial Sensation: Reduced Velum: Within Functional Limits Mandible: Within  Functional Limits Motor Speech Respiration: Within functional limits Phonation: Normal Resonance: Within functional limits Level of Impairment: Conversation Intelligibility: Intelligibility reduced Conversation: 75-100% accurate Motor Planning: Witnin functional limits  SLP Goals  Home Exercise SLP Goal: Patient will Perform Home Exercise Program: Independently SLP  Short Term Goals SLP Short Term Goal 1: Pt willl perform oral motor movements for increased left facial sensation/movement 100%  SLP Short Term Goal 2: Pt will utilize speech strategies to improve articulation precision/intelligibility to 100% at conversational level SLP Short Term Goal 3: Pt will improve reading at sentence/paragraph level to 90%  SLP Short Term Goal 4: Pt will improve STM skills for return to everyday household activities and for return to work activities SLP Short Term Goal 5: Pt will improve cogntive functioning for problem solving/reasoning/organization to 90% SLP Long Term Goals SLP Long Term Goal 1: Pt will perform HEP independently for oral motor/dysarthria 100% without assist SLP Long Term Goal 2: Pt will utilze strats for intelligibility at conversational level to 100% SLP Long Term Goal 3: P t will improve reading skills to return to reading for work/pleasure SLP Long Term Goal 4: Pt  will improve cognition skills to return to work and perform household tasks as prior  Assessment/Plan  Patient Active Problem List   Diagnosis Date Noted  . OSA (obstructive sleep apnea) 11/16/2012  . Essential hypertension, benign 11/16/2012  . Other and unspecified hyperlipidemia 11/16/2012  . Acute ischemic left MCA stroke 11/16/2012  . HNP (herniated nucleus pulposus), lumbar 10/07/2012   SLP - End of Session Activity Tolerance: Patient tolerated treatment well  SLP Assessment/Plan Clinical Impression Statement: pt exhibiting mild dysarthria, mild/mod cognitive deficits and decreased reading skills  folowing CVA Speech Therapy Frequency: min 2x/week Duration: 4 weeks Treatment/Interventions: Language facilitation;Compensatory techniques;Oral motor exercises;Compensatory strategies;Patient/family education;Cognitive reorganization Potential to Achieve Goals: Good  GN   Meda Klinefelter 11/24/2012, 4:57 PM  Physician Documentation Your signature is required to indicate approval of the treatment plan as stated above.  Please sign and either send electronically or make a copy of this report for your files and return this physician signed original.  Please mark one 1.__approve of plan  2. ___approve of plan with the following conditions.   ______________________________                                                          _____________________ Physician Signature                                                                                                             Date

## 2012-12-01 ENCOUNTER — Ambulatory Visit (HOSPITAL_COMMUNITY)
Admission: RE | Admit: 2012-12-01 | Discharge: 2012-12-01 | Disposition: A | Discharge status: Home / Self Care | Payer: BC Managed Care – PPO | Source: Ambulatory Visit | Attending: Neurology | Admitting: Neurology

## 2012-12-01 DIAGNOSIS — I69928 Other speech and language deficits following unspecified cerebrovascular disease: Secondary | ICD-10-CM | POA: Insufficient documentation

## 2012-12-01 DIAGNOSIS — I69922 Dysarthria following unspecified cerebrovascular disease: Secondary | ICD-10-CM | POA: Insufficient documentation

## 2012-12-01 DIAGNOSIS — IMO0001 Reserved for inherently not codable concepts without codable children: Secondary | ICD-10-CM | POA: Insufficient documentation

## 2012-12-01 NOTE — Progress Notes (Signed)
Speech Language Pathology Treatment Patient Details  Name: Mathew Bernard MRN: [id number] Date of Birth: [birth year]  Today's Date: 12/01/2012 Time: 1137-1230 SLP Time Calculation (min): 53 min  Authorization: [employer] workers comp  Authorization Time Period:    Authorization Visit#:  2 of     HPI:  Symptoms/Limitations Symptoms: "I'm doing well." Pain Assessment Currently in Pain?: No/denies   Treatment  Cognitive-Linguistic Therapy Oral Motor Exercises Patient/Family Education Home Exercise Program  SLP Goals  Home Exercise SLP Goal: Patient will Perform Home Exercise Program: Independently SLP Goal: Perform Home Exercise Program - Progress: Progressing toward goal SLP Short Term Goals SLP Short Term Goal 1: Pt willl perform oral motor movements for increased left facial sensation/movement 100%  SLP Short Term Goal 1 - Progress: Progressing toward goal SLP Short Term Goal 2: Pt will utilize speech strategies to improve articulation precision/intelligibility to 100% at conversational level SLP Short Term Goal 2 - Progress: Progressing toward goal SLP Short Term Goal 3: Pt will improve reading comprehension at sentence/paragraph level to 90% acc SLP Short Term Goal 3 - Progress: Progressing toward goal SLP Short Term Goal 4: Pt will improve STM skills for return to everyday household activities and for return to work activities SLP Short Term Goal 4 - Progress: Progressing toward goal SLP Short Term Goal 5: Pt will improve cogntive functioning for problem solving/reasoning/organization to 90% SLP Short Term Goal 5 - Progress: Progressing toward goal SLP Long Term Goals SLP Long Term Goal 1: Pt will perform HEP independently for oral motor/dysarthria 100% without assist SLP Long Term Goal 1 - Progress: Progressing toward goal SLP Long Term Goal 2: Pt will utilze strats for intelligibility at conversational level to 100% SLP Long Term Goal 2 - Progress: Progressing toward  goal SLP Long Term Goal 3: P t will improve reading skills to return to reading for work/pleasure SLP Long Term Goal 3 - Progress: Progressing toward goal SLP Long Term Goal 4: Pt  will improve cognition skills to return to work and perform household tasks as prior SLP Long Term Goal 4 - Progress: Progressing toward goal  Assessment/Plan  Patient Active Problem List   Diagnosis Date Noted  . OSA (obstructive sleep apnea) 11/16/2012  . Essential hypertension, benign 11/16/2012  . Other and unspecified hyperlipidemia 11/16/2012  . Acute ischemic left MCA stroke 11/16/2012  . 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 He injured his back at work on May 28 and required surgery. He sustained a right basal ganglia stroke in mid July and was given tPa, residual deficits have been mild. He states that he has been completing oral motor exercises given to him last week by evaluating SLP and feels that he already has improved movement on his right side. Pt now with only very mild buccal and eyelid drooping and mild dysarthria. He hopes to return to work in September once relased by physician. Job duties require him to use his smart phone, send and receive texts from main office, make phone calls, order parts, and organize purchase orders. He does not use a planner or calendar and says that he usually tries to just "remember things". I encouraged him to initiate use of a notebook or planner to help faciliate memory and organization. He feels his reading comprehension/processing is much  slower now. In session, he was given a reading comprehension/planning task. Oral reading was ~80% with ommission of words. Pt completed task with mod/max cues. He initially just copied the  order of how the tasks were listed, missing the details of specifications. He was given a folder with work for homework and asked to call me this afternoon to see if his schedule conflicted with PT at [facility].  Speech Therapy Frequency: min 2x/week Duration: 4 weeks Treatment/Interventions: Language facilitation;Compensatory techniques;Oral motor exercises;Compensatory strategies;Patient/family education;Cognitive reorganization Potential to Achieve Goals: Good  GN   Thank you,  Havery Moros, CCC-SLP [phone]  [name],[name] 12/01/2012, 3:50 PM

## 2012-12-05 ENCOUNTER — Ambulatory Visit (HOSPITAL_COMMUNITY)
Admission: RE | Admit: 2012-12-05 | Discharge: 2012-12-05 | Disposition: A | Discharge status: Home / Self Care | Payer: BC Managed Care – PPO | Source: Ambulatory Visit | Attending: Family Medicine | Admitting: Family Medicine

## 2012-12-05 NOTE — Progress Notes (Signed)
Speech Language Pathology Treatment Patient Details  Name: Mathew Bernard MRN: [id number] Date of Birth: 01/16/1951  Today's Date: 12/05/2012 Time: 0930-1030 SLP Time Calculation (min): 60 min  Authorization: Generic workers comp  Authorization Time Period:    Authorization Visit#:  3 of  8   HPI:  Symptoms/Limitations Symptoms: "I'm doing so-so." Pain Assessment Currently in Pain?: No/denies  Treatment  Cognitive-Linguistic Therapy Oral Motor Exercises Patient/Family Education Home Exercise Program  SLP Goals  Home Exercise SLP Goal: Patient will Perform Home Exercise Program: Independently SLP Goal: Perform Home Exercise Program - Progress: Progressing toward goal SLP Short Term Goals SLP Short Term Goal 1: Pt willl perform oral motor movements for increased left facial sensation/movement 100%  SLP Short Term Goal 1 - Progress: Progressing toward goal SLP Short Term Goal 2: Pt will utilize speech strategies to improve articulation precision/intelligibility to 100% at conversational level SLP Short Term Goal 2 - Progress: Progressing toward goal SLP Short Term Goal 3: Pt will improve reading comprehension at sentence/paragraph level to 90% acc SLP Short Term Goal 3 - Progress: Progressing toward goal SLP Short Term Goal 4: Pt will improve STM skills for return to everyday household activities and for return to work activities SLP Short Term Goal 4 - Progress: Progressing toward goal SLP Short Term Goal 5: Pt will improve cogntive functioning for problem solving/reasoning/organization to 90% SLP Short Term Goal 5 - Progress: Progressing toward goal SLP Long Term Goals SLP Long Term Goal 1: Pt will perform HEP independently for oral motor/dysarthria 100% without assist SLP Long Term Goal 1 - Progress: Progressing toward goal SLP Long Term Goal 2: Pt will utilze strats for intelligibility at conversational level to 100% SLP Long Term Goal 2 - Progress: Progressing toward  goal SLP Long Term Goal 3: P t will improve reading skills to return to reading for work/pleasure SLP Long Term Goal 3 - Progress: Progressing toward goal SLP Long Term Goal 4: Pt  will improve cognition skills to return to work and perform household tasks as prior SLP Long Term Goal 4 - Progress: Progressing toward goal  Assessment/Plan  Patient Active Problem List   Diagnosis Date Noted  . OSA (obstructive sleep apnea) 11/16/2012  . Essential hypertension, benign 11/16/2012  . Other and unspecified hyperlipidemia 11/16/2012  . Acute ischemic left MCA stroke 11/16/2012  . HNP (herniated nucleus pulposus), lumbar 10/07/2012   SLP - End of Session Activity Tolerance: Patient tolerated treatment well  SLP Assessment/Plan Clinical Impression Statement: Mathew Bernard stated that homework was very challenging (deduction/organization/planning), although he completed with 80% acc. He verbalizes that he has always been a slow reader, but that he would have easily been able to complete tasks given prior to his stroke. His comprehension improves with re-reading text, verbal mediation, and when therapist reads aloud. I do think he is having some visual acuity difficulties, but comprehension and processing also plays a big part. He should be receiving new glasses on the 13th of August. He completed basic paragraph level reading comprehension  tasks with 95% acc when using multiple choice responses. We completed a deduction story together and he required mod/max cues for completion. For homework he was given a "following written directions" task and "making inferences". Speech Therapy Frequency: min 2x/week Duration: 4 weeks Treatment/Interventions: Language facilitation;Compensatory techniques;Oral motor exercises;Compensatory strategies;Patient/family education;Cognitive reorganization Potential to Achieve Goals: Good  GN   Thank you,  Havery Moros, CCC-SLP [phone]  [name],[name] 12/05/2012,  11:00 AM

## 2012-12-09 ENCOUNTER — Ambulatory Visit (HOSPITAL_COMMUNITY)
Admission: RE | Admit: 2012-12-09 | Discharge: 2012-12-09 | Disposition: A | Discharge status: Home / Self Care | Payer: BC Managed Care – PPO | Source: Ambulatory Visit | Attending: Neurology | Admitting: Neurology

## 2012-12-09 NOTE — Progress Notes (Signed)
Speech Language Pathology Treatment Patient Details  Name: Mathew Bernard MRN: [id number] Date of Birth: 1950-06-17  Today's Date: 12/09/2012 Time: 0930-1018 SLP Time Calculation (min): 48 min  Authorization: Generic workers comp  Authorization Time Period:    Authorization Visit#:  3 of 12   HPI:  Symptoms/Limitations Symptoms: "Doing okay."    Assessments Prior Functional Status Cognitive/Linguistic Baseline: Within functional limits  Treatment  Cognitive Therapy Reading Comprehension  SLP Goals  Home Exercise SLP Goal: Patient will Perform Home Exercise Program: Independently SLP Goal: Perform Home Exercise Program - Progress: Progressing toward goal SLP Short Term Goals SLP Short Term Goal 1: Pt willl perform oral motor movements for increased left facial sensation/movement 100%  SLP Short Term Goal 1 - Progress: Progressing toward goal SLP Short Term Goal 2: Pt will utilize speech strategies to improve articulation precision/intelligibility to 100% at conversational level SLP Short Term Goal 2 - Progress: Progressing toward goal SLP Short Term Goal 3: Pt will improve reading comprehension at sentence/paragraph level to 90% acc SLP Short Term Goal 3 - Progress: Progressing toward goal SLP Short Term Goal 4: Pt will improve STM skills for return to everyday household activities and for return to work activities SLP Short Term Goal 4 - Progress: Progressing toward goal SLP Short Term Goal 5: Pt will improve cogntive functioning for problem solving/reasoning/organization to 90% SLP Short Term Goal 5 - Progress: Progressing toward goal SLP Long Term Goals SLP Long Term Goal 1: Pt will perform HEP independently for oral motor/dysarthria 100% without assist SLP Long Term Goal 1 - Progress: Progressing toward goal SLP Long Term Goal 2: Pt will utilze strats for intelligibility at conversational level to 100% SLP Long Term Goal 2 - Progress: Progressing toward goal SLP Long  Term Goal 3: P t will improve reading skills to return to reading for work/pleasure SLP Long Term Goal 3 - Progress: Progressing toward goal SLP Long Term Goal 4: Pt  will improve cognition skills to return to work and perform household tasks as prior SLP Long Term Goal 4 - Progress: Progressing toward goal  Assessment/Plan  Patient Active Problem List   Diagnosis Date Noted  . OSA (obstructive sleep apnea) 11/16/2012  . Essential hypertension, benign 11/16/2012  . Other and unspecified hyperlipidemia 11/16/2012  . Acute ischemic left MCA stroke 11/16/2012  . HNP (herniated nucleus pulposus), lumbar 10/07/2012   SLP - End of Session Activity Tolerance: Patient tolerated treatment well  SLP Assessment/Plan Clinical Impression Statement: Pt arrived early for tx session, but stated he forgot his homework folder. He said he laid it out the night before on the table, but realized that he forgot it when he parked his car at [facility]. SLP provided structured tasks where pt was asked to read complex directions to self and perform some sort of written direction for each item. Pt achieved ~80% with this task and was able to self-correct as SLP reviewed answers. Provided paragraph reading comprehension tasks, pt answered questions related to content in 100% of opportunities. While making inferences regarding material, pt achieved 90% when read to himself. Pt was provided homework related to all activities practiced today in tx.  Speech Therapy Frequency: min 2x/week Duration: 4 weeks Treatment/Interventions: Language facilitation;Compensatory techniques;Oral motor exercises;Compensatory strategies;Patient/family education;Cognitive reorganization Potential to Achieve Goals: Good  GN    Diem Dicocco S 12/09/2012, 10:25 AM

## 2012-12-12 ENCOUNTER — Ambulatory Visit (HOSPITAL_COMMUNITY)
Admission: RE | Admit: 2012-12-12 | Discharge: 2012-12-12 | Disposition: A | Discharge status: Home / Self Care | Payer: BC Managed Care – PPO | Source: Ambulatory Visit | Attending: Speech Pathology | Admitting: Speech Pathology

## 2012-12-12 NOTE — Progress Notes (Signed)
Speech Language Pathology Treatment Patient Details  Name: Mathew Bernard MRN: [id number] Date of Birth: Sep 23, 1950  Today's Date: 12/12/2012 Time: 1030-1140 SLP Time Calculation (min): 70 min  Authorization: Generic workers comp  Authorization Time Period:    Authorization Visit#:  5 of 8   HPI:  Symptoms/Limitations Symptoms: "I'm doing better." Pain Assessment Currently in Pain?: No/denies  Treatment  Cognitive-Linguistic Therapy Patient/Family Education Home Exercise Program  SLP Goals  Home Exercise SLP Goal: Patient will Perform Home Exercise Program: Independently SLP Short Term Goals SLP Short Term Goal 1: Pt willl perform oral motor movements for increased left facial sensation/movement 100%  SLP Short Term Goal 1 - Progress: Met SLP Short Term Goal 2: Pt will utilize speech strategies to improve articulation precision/intelligibility to 100% at conversational level SLP Short Term Goal 2 - Progress: Met SLP Short Term Goal 3: Pt will improve reading comprehension at sentence/paragraph level to 90% acc SLP Short Term Goal 3 - Progress: Progressing toward goal SLP Short Term Goal 4: Pt will improve STM skills for return to everyday household activities and for return to work activities SLP Short Term Goal 4 - Progress: Progressing toward goal SLP Short Term Goal 5: Pt will improve cogntive functioning for problem solving/reasoning/organization to 90% SLP Short Term Goal 5 - Progress: Progressing toward goal SLP Long Term Goals SLP Long Term Goal 1: Pt will perform HEP independently for oral motor/dysarthria 100% without assist SLP Long Term Goal 1 - Progress: Met SLP Long Term Goal 2: Pt will utilze strats for intelligibility at conversational level to 100% SLP Long Term Goal 2 - Progress: Met SLP Long Term Goal 3: P t will improve reading skills to return to reading for work/pleasure SLP Long Term Goal 3 - Progress: Progressing toward goal SLP Long Term Goal 4: Pt   will improve cognition skills to return to work and perform household tasks as prior SLP Long Term Goal 4 - Progress: Progressing toward goal  Assessment/Plan  Patient Active Problem List   Diagnosis Date Noted  . OSA (obstructive sleep apnea) 11/16/2012  . Essential hypertension, benign 11/16/2012  . Other and unspecified hyperlipidemia 11/16/2012  . Acute ischemic left MCA stroke 11/16/2012  . HNP (herniated nucleus pulposus), lumbar 10/07/2012   SLP - End of Session Activity Tolerance: Patient tolerated treatment well General Behavior During Therapy: WFL for tasks assessed/performed  SLP Assessment/Plan Clinical Impression Statement: Pt received his new glasses and he reports that the new prescription helps his reading tremendously. He says that he thinks he is getting a lot better- denies difficulty with dysarthria and memory. He was able to read short paragraphs and make inferences with 80% acc (cues for differentiating between unknown and false). He completed "Attention to details" task with 90% acc and was able to self correct with clinician review. For homework, he read a short story (5 paragraphs) and answered questions with 100% acc, but required re-read of text several times. Memory strategies were provided and reviewed with pt. He plans to use a small writing tablet at work to help him organize his projects at work. He had a lot of difficulty with Deductive Reasoning tasks today and needed step by step cueing to complete. I gave him strategies on ways to make the tasks more concrete which did seem to help.  Speech Therapy Frequency: min 2x/week Duration: 2 weeks Treatment/Interventions: Language facilitation;Compensatory techniques;Oral motor exercises;Compensatory strategies;Patient/family education;Cognitive reorganization Potential to Achieve Goals: Good  GN    [name],[name] 12/12/2012, 12:09 PM

## 2012-12-15 ENCOUNTER — Ambulatory Visit (HOSPITAL_COMMUNITY)
Admission: RE | Admit: 2012-12-15 | Discharge: 2012-12-15 | Disposition: A | Discharge status: Home / Self Care | Payer: BC Managed Care – PPO | Source: Ambulatory Visit | Attending: Speech Pathology | Admitting: Speech Pathology

## 2012-12-15 NOTE — Progress Notes (Signed)
Speech Language Pathology Treatment Patient Details  Name: Mathew Bernard MRN: [id number] Date of Birth: Feb 18, 1951  Today's Date: 12/15/2012 Time: 1345- 1430    Authorization: Generic workers comp  Authorization Time Period:    Authorization Visit#:  6 of 8   HPI:  Symptoms/Limitations Symptoms: "I don't know how I'm doing." Pain Assessment Currently in Pain?: No/denies  Treatment  Cognitive-Linguistic Therapy Patient/Family Education Home Exercise Program  SLP Goals  Home Exercise SLP Goal: Patient will Perform Home Exercise Program: Independently SLP Goal: Perform Home Exercise Program - Progress: Progressing toward goal SLP Short Term Goals SLP Short Term Goal 1: Pt willl perform oral motor movements for increased left facial sensation/movement 100%  SLP Short Term Goal 1 - Progress: Met SLP Short Term Goal 2: Pt will utilize speech strategies to improve articulation precision/intelligibility to 100% at conversational level SLP Short Term Goal 2 - Progress: Met SLP Short Term Goal 3: Pt will improve reading comprehension at sentence/paragraph level to 90% acc SLP Short Term Goal 3 - Progress: Progressing toward goal SLP Short Term Goal 4: Pt will improve STM skills for return to everyday household activities and for return to work activities SLP Short Term Goal 4 - Progress: Progressing toward goal SLP Short Term Goal 5: Pt will improve cogntive functioning for problem solving/reasoning/organization to 90% SLP Short Term Goal 5 - Progress: Progressing toward goal SLP Long Term Goals SLP Long Term Goal 1: Pt will perform HEP independently for oral motor/dysarthria 100% without assist SLP Long Term Goal 1 - Progress: Met SLP Long Term Goal 2: Pt will utilze strats for intelligibility at conversational level to 100% SLP Long Term Goal 2 - Progress: Met SLP Long Term Goal 3: P t will improve reading skills to return to reading for work/pleasure SLP Long Term Goal 3 -  Progress: Progressing toward goal SLP Long Term Goal 4: Pt  will improve cognition skills to return to work and perform household tasks as prior SLP Long Term Goal 4 - Progress: Progressing toward goal  Assessment/Plan  Patient Active Problem List   Diagnosis Date Noted  . OSA (obstructive sleep apnea) 11/16/2012  . Essential hypertension, benign 11/16/2012  . Other and unspecified hyperlipidemia 11/16/2012  . Acute ischemic left MCA stroke 11/16/2012  . HNP (herniated nucleus pulposus), lumbar 10/07/2012   SLP - End of Session Activity Tolerance: Patient tolerated treatment well General Behavior During Therapy: [facility] for tasks assessed/performed  SLP Assessment/Plan Clinical Impression Statement: Mr. Mathew Bernard states that homework was difficult but he achieved 100% acc. He reports that it took him quite a while to complete. In session, he was given a mod/complex sequencing and organization task (sorting billing sheets and tallying quantities) and after he read through directions he needed cues from SLP to explain task. While, he was able to sort the bills into the correct piles and list items ordered, he did not figure out the quantities. In a pencil/paper scheduling task he completed it with 80% acc with a lot of erasing/self correcting. He acknowledges that he does not read through carefully before starting to write answers down. He needs assist to break the task down into smaller, more manageable parts in order to complete.  Speech Therapy Frequency: min 2x/week Duration: 2 weeks Treatment/Interventions: Language facilitation;Compensatory techniques;Oral motor exercises;Compensatory strategies;Patient/family education;Cognitive reorganization Potential to Achieve Goals: Good  GN    Mathew Bernard 12/15/2012, 3:10 PM

## 2012-12-22 ENCOUNTER — Ambulatory Visit (HOSPITAL_COMMUNITY)
Admission: RE | Admit: 2012-12-22 | Discharge: 2012-12-22 | Disposition: A | Discharge status: Home / Self Care | Payer: BC Managed Care – PPO | Source: Ambulatory Visit | Attending: Family Medicine | Admitting: Family Medicine

## 2012-12-22 NOTE — Progress Notes (Signed)
Speech Language Pathology Treatment Patient Details  Name: Mathew Bernard MRN: [id number] Date of Birth: [birth year]  Today's Date: 12/22/2012 Time: 0930-1020 SLP Time Calculation (min): 50 min  Authorization: Generic workers comp  Authorization Time Period:    Authorization Visit#:  7 of 8   HPI:  Symptoms/Limitations Symptoms: "Doing ok, I guess." Pain Assessment Currently in Pain?: No/denies  Treatment  Cognitive-Linguistic Therapy Reading Comprehension Patient/Family Education Home Exercise Program  SLP Goals  Home Exercise SLP Goal: Patient will Perform Home Exercise Program: Independently SLP Goal: Perform Home Exercise Program - Progress: Progressing toward goal SLP Short Term Goals SLP Short Term Goal 1: Pt willl perform oral motor movements for increased left facial sensation/movement 100%  SLP Short Term Goal 1 - Progress: Met SLP Short Term Goal 2: Pt will utilize speech strategies to improve articulation precision/intelligibility to 100% at conversational level SLP Short Term Goal 2 - Progress: Met SLP Short Term Goal 3: Pt will improve reading comprehension at sentence/paragraph level to 90% acc SLP Short Term Goal 3 - Progress: Met SLP Short Term Goal 4: Pt will improve STM skills for return to everyday household activities and for return to work activities SLP Short Term Goal 4 - Progress: Met SLP Short Term Goal 5: Pt will improve cogntive functioning for problem solving/reasoning/organization to 90% SLP Short Term Goal 5 - Progress: Progressing toward goal SLP Long Term Goals SLP Long Term Goal 1: Pt will perform HEP independently for oral motor/dysarthria 100% without assist SLP Long Term Goal 1 - Progress: Met SLP Long Term Goal 2: Pt will utilze strats for intelligibility at conversational level to 100% SLP Long Term Goal 2 - Progress: Met SLP Long Term Goal 3: P t will improve reading skills to return to reading for work/pleasure SLP Long Term Goal 3  - Progress: Progressing toward goal SLP Long Term Goal 4: Pt  will improve cognition skills to return to work and perform household tasks as prior SLP Long Term Goal 4 - Progress: Progressing toward goal  Assessment/Plan  Patient Active Problem List   Diagnosis Date Noted  . OSA (obstructive sleep apnea) 11/16/2012  . Essential hypertension, benign 11/16/2012  . Other and unspecified hyperlipidemia 11/16/2012  . Acute ischemic left MCA stroke 11/16/2012  . HNP (herniated nucleus pulposus), lumbar 10/07/2012   SLP - End of Session Activity Tolerance: Patient tolerated treatment well General Behavior During Therapy: [facility] for tasks assessed/performed  SLP Assessment/Plan Clinical Impression Statement: Mr. Hovater completed the basic paragraph-level reading comprehension assignment with 100% acc with allowance to re-read text. He had more difficulty with the mod-level sequencing and organization/planning reading task. He is not having difficulty functionally with sequencing and planning tasks at home. His difficulty appears to be related to processing what he reads. He beneftis from clinician re-reading text aloud to him. He hopes to return to work in the next week or so and I think he will do ok, given that his work environment sounds to be relaxed and he should be able to take his time with specific tasks.  Speech Therapy Frequency: min 1 x/week Duration: 1 week Treatment/Interventions: Language facilitation;Compensatory techniques;Oral motor exercises;Compensatory strategies;Patient/family education;Cognitive reorganization Potential to Achieve Goals: Good  GN   Thank you,  Havery Moros, CCC-SLP [phone]  [name],[name] 12/22/2012, 2:25 PM

## 2012-12-29 ENCOUNTER — Ambulatory Visit (HOSPITAL_COMMUNITY)
Admission: RE | Admit: 2012-12-29 | Discharge: 2012-12-29 | Disposition: A | Discharge status: Home / Self Care | Payer: BC Managed Care – PPO | Source: Ambulatory Visit | Attending: Neurology | Admitting: Neurology

## 2012-12-29 DIAGNOSIS — IMO0001 Reserved for inherently not codable concepts without codable children: Secondary | ICD-10-CM | POA: Insufficient documentation

## 2012-12-29 DIAGNOSIS — I69922 Dysarthria following unspecified cerebrovascular disease: Secondary | ICD-10-CM | POA: Insufficient documentation

## 2012-12-29 DIAGNOSIS — I69928 Other speech and language deficits following unspecified cerebrovascular disease: Secondary | ICD-10-CM | POA: Insufficient documentation

## 2012-12-29 NOTE — Progress Notes (Signed)
Speech Language Pathology Treatment/Discharge Summary Patient Details  Name: Mathew Bernard MRN: [id number] Date of Birth: [birth year]  Today's Date: 12/29/2012 Time: 1324-4010 SLP Time Calculation (min): 48 min  Authorization: Generic workers comp  Authorization Time Period:    Authorization Visit#:  8 of 8   HPI:  Symptoms/Limitations Symptoms: "I'm going back to work on Monday." Pain Assessment Currently in Pain?: No/denies  Assessments Prior Functional Status Cognitive/Linguistic Baseline: Within functional limits Type of Home: House  Lives With: Spouse Available Help at Discharge: Family;Available 24 hours/day Vocation: Full time employment Cognition Overall Cognitive Status: Impaired/Different from baseline Arousal/Alertness: Awake/alert Orientation Level: Oriented X4 Attention: Focused Focused Attention: Appears intact Sustained Attention: Appears intact Selective Attention: Impaired Selective Attention Impairment: Verbal complex;Functional complex Awareness: Appears intact Problem Solving: Impaired Problem Solving Impairment: Functional complex Executive Function: Organizing Reasoning: Impaired Reasoning Impairment: Functional complex Organizing: Impaired Organizing Impairment: Functional complex Self Monitoring: Appears intact Safety/Judgment: Appears intact Auditory Comprehension Overall Auditory Comprehension: Appears within functional limits for tasks assessed Yes/No Questions: Within Functional Limits Commands: Within Functional Limits Conversation: Complex Visual Recognition/Discrimination Discrimination: Within Function Limits Reading Comprehension Reading Status: Impaired Word level: Within functional limits Sentence Level: Within functional limits Paragraph Level: Impaired Functional Environmental (signs, name badge): Within functional limits Interfering Components: Visual acuity;Processing time Effective Techniques: Eye  glasses Expression Primary Mode of Expression: Verbal Verbal Expression Overall Verbal Expression: Appears within functional limits for tasks assessed Written Expression Dominant Hand: Right Written Expression: Within Functional Limits Oral Motor/Sensory Function Overall Oral Motor/Sensory Function: Appears within functional limits for tasks assessed Motor Speech Overall Motor Speech: Appears within functional limits for tasks assessed  Treatment  Cognitive-Linguistic Therapy Patient/Family Education Home Exercise Program  SLP Goals  Home Exercise SLP Goal: Patient will Perform Home Exercise Program: Independently SLP Goal: Perform Home Exercise Program - Progress: Met SLP Short Term Goals SLP Short Term Goal 1: Pt willl perform oral motor movements for increased left facial sensation/movement 100%  SLP Short Term Goal 1 - Progress: Met SLP Short Term Goal 2: Pt will utilize speech strategies to improve articulation precision/intelligibility to 100% at conversational level SLP Short Term Goal 2 - Progress: Met SLP Short Term Goal 3: Pt will improve reading comprehension at sentence/paragraph level to 90% acc SLP Short Term Goal 3 - Progress: Met SLP Short Term Goal 4: Pt will improve STM skills for return to everyday household activities and for return to work activities SLP Short Term Goal 4 - Progress: Met SLP Short Term Goal 5: Pt will improve cogntive functioning for problem solving/reasoning/organization to 90% SLP Short Term Goal 5 - Progress: Met SLP Long Term Goals SLP Long Term Goal 1: Pt will perform HEP independently for oral motor/dysarthria 100% without assist SLP Long Term Goal 1 - Progress: Met SLP Long Term Goal 2: Pt will utilze strats for intelligibility at conversational level to 100% SLP Long Term Goal 2 - Progress: Met SLP Long Term Goal 3: P t will improve reading skills to return to reading for work/pleasure SLP Long Term Goal 3 - Progress: Met SLP Long  Term Goal 4: Pt  will improve cognition skills to return to work and perform household tasks as prior SLP Long Term Goal 4 - Progress: Met  Assessment/Plan  Patient Active Problem List   Diagnosis Date Noted  . OSA (obstructive sleep apnea) 11/16/2012  . Essential hypertension, benign 11/16/2012  . Other and unspecified hyperlipidemia 11/16/2012  . Acute ischemic left MCA stroke 11/16/2012  . HNP (herniated nucleus  pulposus), lumbar 10/07/2012   SLP - End of Session Activity Tolerance: Patient tolerated treatment well General Behavior During Therapy: [facility] for tasks assessed/performed  SLP Assessment/Plan Clinical Impression Statement: Mr. Meyers has been released to return to work on Monday. He was able to relay current limitations and provide ways to manage at work. Complex reading comprehension and organization (scheduling/organizing functional written information) continue to be minimally impaired. He is generally able to compensate for the impairment by re-reading text and writing down key information. He will utilize a calendar/log book at work to help with organization. Mr. Conant met all goals and is discharged from speech pathology services at this time.  Treatment/Interventions: Language facilitation;Compensatory techniques;Oral motor exercises;Compensatory strategies;Patient/family education;Cognitive reorganization Potential to Achieve Goals: Good     Thank you,  Havery Moros, CCC-SLP [phone]  Chyanna Flock 12/29/2012, 2:52 PM                                                     Physician Treatment Plan  Your signature is required to indicate approval of the treatment plan/progress as stated above. Please make a copy of this report for your files and return this physician signed original in the self-addressed envelope or fax to [phone]  [phone]. COMMENTS/CHANGES:__________________________________________________________________________________________________________________________   ____________________________________                      ____________________ Benjamin Stain                                                    DATE

## 2013-01-17 ENCOUNTER — Ambulatory Visit (INDEPENDENT_AMBULATORY_CARE_PROVIDER_SITE_OTHER): Payer: BC Managed Care – PPO | Admitting: Nurse Practitioner

## 2013-01-17 ENCOUNTER — Encounter: Payer: Self-pay | Admitting: Nurse Practitioner

## 2013-01-17 VITALS — BP 138/74 | HR 55 | Temp 97.8°F | Ht 68.5 in | Wt 212.0 lb

## 2013-01-17 DIAGNOSIS — G4733 Obstructive sleep apnea (adult) (pediatric): Secondary | ICD-10-CM

## 2013-01-17 DIAGNOSIS — I6350 Cerebral infarction due to unspecified occlusion or stenosis of unspecified cerebral artery: Secondary | ICD-10-CM

## 2013-01-17 DIAGNOSIS — I63512 Cerebral infarction due to unspecified occlusion or stenosis of left middle cerebral artery: Secondary | ICD-10-CM

## 2013-01-17 DIAGNOSIS — E785 Hyperlipidemia, unspecified: Secondary | ICD-10-CM

## 2013-01-17 MED ORDER — PRAVASTATIN SODIUM 40 MG PO TABS: 40.0000 mg | ORAL_TABLET | Freq: Every day | ORAL | Status: DC

## 2013-01-17 MED ORDER — ASPIRIN EC 81 MG PO TBEC: 81.0000 mg | DELAYED_RELEASE_TABLET | Freq: Every day | ORAL | Status: AC

## 2013-01-17 NOTE — Patient Instructions (Addendum)
Continue aspirin 81 mg orally every day  for secondary stroke prevention and maintain strict control of hypertension with blood pressure goal below 130/90, diabetes with hemoglobin A1c goal below 6.5% and lipids with LDL cholesterol goal below 100 mg/dL.   Followup in 3 months.  STROKE/TIA INSTRUCTIONS SMOKING Cigarette smoking nearly doubles your risk of having a stroke & is the single most alterable risk factor  If you smoke or have smoked in the last 12 months, you are advised to quit smoking for your health.  Most of the excess cardiovascular risk related to smoking disappears within a year of stopping.  Ask you doctor about anti-smoking medications  [place] Quit Line: 1-800-QUIT NOW  Free Smoking Cessation Classes ([phone]  CHOLESTEROL Know your levels; limit fat & cholesterol in your diet  Lab Results  Component Value Date   CHOL 271* 11/15/2012   HDL 39* 11/15/2012   LDLCALC 206* 11/15/2012   TRIG 128 11/15/2012   CHOLHDL 6.9 11/15/2012      Many patients benefit from treatment even if their cholesterol is at goal.  Goal: Total Cholesterol less than 160  Goal:  LDL less than 100  Goal:  HDL greater than 40  Goal:  Triglycerides less than 150  BLOOD PRESSURE American Stroke Association blood pressure target is less that 120/80 mm/Hg  Your discharge blood pressure is:  BP: 138/74 mmHg  Monitor your blood pressure  Limit your salt and alcohol intake  Many individuals will require more than one medication for high blood pressure  DIABETES (A1c is a blood sugar average for last 3 months) Goal A1c is under 7% (A1c is blood sugar average for last 3 months)  Diabetes: No known diagnosis of diabetes    Lab Results  Component Value Date   HGBA1C 6.6* 11/15/2012    Your A1c can be lowered with medications, healthy diet, and exercise.  Check your blood sugar as directed by your physician  Call your physician if you experience unexplained or low blood sugars.  PHYSICAL  ACTIVITY/REHABILITATION Goal is 30 minutes at least 4 days per week    Activity decreases your risk of heart attack and stroke and makes your heart stronger.  It helps control your weight and blood pressure; helps you relax and can improve your mood.  Participate in a regular exercise program.  Talk with your doctor about the best form of exercise for you (dancing, walking, swimming, cycling).  DIET/WEIGHT Goal is to maintain a healthy weight  Your height is:  Height: 5' 8.5" (174 cm) Your current weight is: Weight: 212 lb (96.163 kg) Your body Mass Index (BMI) is:  BMI (Calculated): 31.8  Following the type of diet specifically designed for you will help prevent another stroke.  Your goal Body Mass Index (BMI) is 19-24.  Healthy food habits can help reduce 3 risk factors for stroke:  High cholesterol, hypertension, and excess weight.

## 2013-01-17 NOTE — Progress Notes (Signed)
[facility]  PATIENT: Mathew Bernard DOB: July 08, 1950   HISTORY FROM: patient, chart REASON FOR VISIT: routine follow up  HISTORY OF PRESENT ILLNESS:  Mathew Bernard is an 61 y.o. male who was eating lunch at 12 on 11/14/12 when he noted weakness of his left arm and leg. This concerned him and he called his wife. His wife also noted some slurred speech. Patient was brought to [facility] hospital as code stroke. Patient initial CT head was negative for bleed. Patient was given TPa and transferred to 3100.  After tPA he did have some lip and tongue swelling which responded to solumedrol and benadryl.  UPDATE 01/17/13 (LL): Mathew Bernard comes to office for hospital stroke follow up.  He has completed outpatient PT/OT and ST.  He has gone back to work and is still improving he states.  He does not have the stamina he did before and states he has some word-finding difficulty.  Sometimes he looses his train of thought.  He has been taking full dose aspirin but has bruises and skin tears all over his arms from his job.  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  He states he has no residual weakness or numbness in his arm or leg.  Fine motor skills are improving.  Blood pressure is well controlled. Statins have been a problem for him in the past, giving him joint pains, especially in the knees.  He was taking Simvastatin 10 mg at time of stroke, and it was increased to 40 mg at discharge due to high lipid numbers.  He would rather keep taking them than risk having another stroke.  REVIEW OF SYSTEMS: Full 14 system review of systems performed and notable only for: constitutional: fatigue  cardiovascular: N/A Eyes: blurred vision respiratory: short of breath, cough endocrine: N/A  ear/nose/throat: hearing loss  Hematology/Lymph: easy bruising musculoskeletal: joint pain skin: N/A genitourinary: N/A Gastrointestinal: N/A allergy/immunology: N/A neurological: memory  loss, weakness sleep: insomnia, restless legs psychiatric: depression, anxiety, decreased energy   ALLERGIES: Allergies  Allergen Reactions  . Alteplase     Lip and tongue swelling  . Benadryl [Diphenhydramine Hcl] Other (See Comments)    Restlessness, anxiety  . Codeine     Jittery     HOME MEDICATIONS: Outpatient Prescriptions Prior to Visit  Medication Sig Dispense Refill  . albuterol (PROVENTIL HFA;VENTOLIN HFA) 108 (90 BASE) MCG/ACT inhaler Inhale 2 puffs into the lungs every 6 (six) hours as needed for wheezing.      . cetirizine (ZYRTEC) 10 MG tablet Take 10 mg by mouth daily as needed for allergies.       . Omega-3 Fatty Acids (FISH OIL) 1200 MG CAPS Take 2,400 mg by mouth 2 (two) times daily.      Marland Kitchen omeprazole (PRILOSEC) 20 MG capsule Take 20 mg by mouth 2 (two) times daily.      . sertraline (ZOLOFT) 100 MG tablet Take 150 mg by mouth at bedtime.      Marland Kitchen aspirin EC 325 MG EC tablet Take 1 tablet (325 mg total) by mouth daily.  30 tablet  0  . simvastatin (ZOCOR) 40 MG tablet Take 1 tablet (40 mg total) by mouth daily at 6 PM.  30 tablet  2  . ofloxacin (OCUFLOX) 0.3 % ophthalmic solution Place 1 drop into the right eye 4 (four) times daily.      . prednisoLONE acetate (PRED FORTE) 1 % ophthalmic suspension Place 1 drop into the right  eye 2 (two) times daily.       No facility-administered medications prior to visit.    PAST MEDICAL HISTORY: Past Medical History  Diagnosis Date  . Hypertension   . High cholesterol   . Thyroid disease   . Complication of anesthesia      "depressed after surgery"  . Hypothyroidism   . Depression   . Anxiety   . Sleep apnea     CPAP  . Pneumonia     hx of in 70's  . History of kidney stones   . GERD (gastroesophageal reflux disease)   . Arthritis     PAST SURGICAL HISTORY: Past Surgical History  Procedure Laterality Date  . Back surgery  2013    cervical  . Nerve surgery      helped with headaches  . Kidney stone  surgery    . Lumbar laminectomy/decompression microdiscectomy N/A 10/07/2012    Procedure: MICRODISCECTOMY L4-5;  Surgeon: Javier Docker, MD;  Location: [facility];  Service: Orthopedics;  Laterality: N/A;    FAMILY HISTORY: No family history on file.  SOCIAL HISTORY: History   Social History  . Marital Status: Married    Spouse Name: N/A    Number of Children: 3  . Years of Education: N/A   Occupational History  . Head Carpenter    Social History Main Topics  . Smoking status: Current Every Day Smoker -- 40 years    Types: Cigarettes  . Smokeless tobacco: Never Used  . Alcohol Use: No  . Drug Use: No  . Sexual Activity: Not on file   Other Topics Concern  . Not on file   Social History Narrative  . No narrative on file     PHYSICAL EXAM  Filed Vitals:   01/17/13 1406  BP: 138/74  Pulse: 55  Temp: 97.8 F (36.6 C)  TempSrc: Oral  Height: 5' 8.5" (1.74 m)  Weight: 212 lb (96.163 kg)   Body mass index is 31.76 kg/(m^2).  Generalized: In no acute distress, pleasant  Caucasian male  Neck: Supple, no carotid bruits   Cardiac: Regular rate rhythm, no murmur   Pulmonary: Clear to auscultation bilaterally   Musculoskeletal: No deformity   Neurological examination   Mentation: Alert oriented to time, place, history taking, language fluent, and casual conversation  Cranial nerve II-XII: Pupils were equal round reactive to light extraocular movements were full, visual field were full on confrontational test. facial sensation and strength were normal. hearing was intact to finger rubbing bilaterally. Uvula tongue midline. head turning and shoulder shrug and were normal and symmetric.Tongue protrusion into cheek strength was normal. MOTOR: normal bulk and tone, full strength in the BUE, BLE, fine finger movements normal, no pronator drift SENSORY: normal and symmetric to light touch, pinprick, temperature COORDINATION: finger-nose-finger, heel-to-shin bilaterally,  there was no truncal ataxia REFLEXES: Equal and symmetric. GAIT/STATION: Rising up from seated position without assistance, normal stance, without trunk ataxia, moderate stride, good arm swing, smooth turning, able to perform tiptoe, and heel walking without difficulty.    DIAGNOSTIC DATA (LABS, IMAGING, TESTING) - I reviewed patient records, labs, notes, testing and imaging myself where available.  Lab Results  Component Value Date   WBC 7.6 11/14/2012   HGB 14.3 11/14/2012   HCT 42.0 11/14/2012   MCV 81.5 11/14/2012   PLT 155 11/14/2012      Component Value Date/Time   NA 139 11/14/2012 1327   K 4.2 11/14/2012 1327   CL 105 11/14/2012  1327   CO2 26 11/14/2012 1318   GLUCOSE 119* 11/14/2012 1327   BUN 19 11/14/2012 1327   CREATININE 1.10 11/14/2012 1327   CALCIUM 9.0 11/14/2012 1318   PROT 6.8 11/14/2012 1318   ALBUMIN 3.5 11/14/2012 1318   AST 22 11/14/2012 1318   ALT 28 11/14/2012 1318   ALKPHOS 87 11/14/2012 1318   BILITOT 0.2* 11/14/2012 1318   GFRNONAA 66* 11/14/2012 1318   GFRAA 76* 11/14/2012 1318   Lab Results  Component Value Date   CHOL 271* 11/15/2012   HDL 39* 11/15/2012   LDLCALC 206* 11/15/2012   TRIG 128 11/15/2012   CHOLHDL 6.9 11/15/2012   Lab Results  Component Value Date   HGBA1C 6.6* 11/15/2012   Ct Head Wo Contrast 11/14/2012 High density in the left middle cerebral artery was probably present on the prior examination 07/07/2012. This may represent partial volume averaging of a atherosclerosis or chronic hyperdense left MCA sign. There are no secondary signs of cerebral ischemia. No definite acute intracranial abnormality. Old small lacunar infarcts are unchanged.  MRI HEAD WITHOUT CONTRAST 11/16/12 Acute to subacute 3 cm right basal ganglia infarct. Mild mass effect. No associated hemorrhage at this time. Otherwise minimal to mild chronic small vessel disease changes.  Right mastoid effusion. MRA of the brain 11/16/12 Patent right MCA with no stenosis or major branch  occlusion identified. Probable infundibulum of the left ICA terminus. Posterior circulation atherosclerosis, predominately mild.  Mild to moderate right PCA stenosis with preserved distal flow. 2D Echocardiogram Left ventricle: The cavity size was normal. Systolic function was normal. The estimated ejection fraction was in the range of 55% to 60%. Wall motion was normal. Carotid Doppler Bilateral: Less than 39% ICA stenosis. Vertebral artery flow is antegrade.   ASSESSMENT AND PLAN Mathew Bernard is a 62 y.o. male presenting with left hemiparesis, slurred speech. Status post IV t-PA. Suspect right brain subcortical infarct. On no antithrombotics prior to admission. Patient with resultant mild word finding difficulties, dysarthria when fatigued.  Continue aspirin 81 mg orally every day  for secondary stroke prevention and maintain strict control of hypertension with blood pressure goal below 130/90, diabetes with hemoglobin A1c goal below 6.5% and lipids with LDL cholesterol goal below 100 mg/dL.  Trial changing Zocor to Pravastatin 40 mg to see if any change in joint pain. Followup in 3 months.  Mathew Corella NP-C 01/17/2013, 3:11 PM [facility] [address] [place], [place] [address] [phone]

## 2013-02-16 ENCOUNTER — Ambulatory Visit: Payer: Self-pay | Admitting: Neurology

## 2013-03-02 ENCOUNTER — Other Ambulatory Visit: Payer: Self-pay

## 2013-03-08 ENCOUNTER — Telehealth: Payer: Self-pay | Admitting: *Deleted

## 2013-03-08 NOTE — Telephone Encounter (Signed)
Called patient left voicemail letting him know his appointment has been cancelled.  And he needs to call the office back to reschedule.

## 2013-03-09 NOTE — Telephone Encounter (Signed)
Called the patient for the second time to see if I  can schedule his appointment . I spoke with his wife and she stated that he feels like he is fine and does not need to be seen anytime soon.

## 2013-04-12 ENCOUNTER — Ambulatory Visit: Payer: Self-pay | Admitting: Nurse Practitioner

## 2013-04-24 ENCOUNTER — Ambulatory Visit: Payer: BC Managed Care – PPO | Admitting: Nurse Practitioner

## 2013-11-29 IMAGING — CT CT HEAD W/O CM
1 series · 15 of 30 positions shown, 19 images · non-contrast
Comparison: 07/07/2012.

CLINICAL DATA: Code stroke.  Sudden onset left-sided weakness at
new.

CT HEAD WITHOUT CONTRAST
TECHNIQUE: Contiguous axial images were obtained from the base of
the skull through the vertex without contrast.

[Series 3: head 5.0 h30s · axial · 0.46mm/px · z∈[-82,+58]mm · 15 of 32 slices shown, 19 images]
[im 2/32  brain]
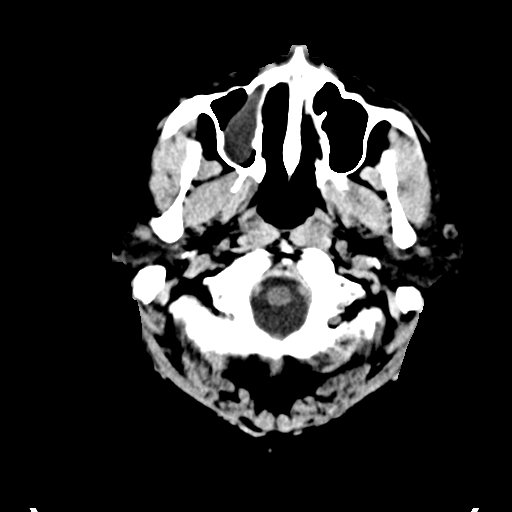
[im 2/32  bone]
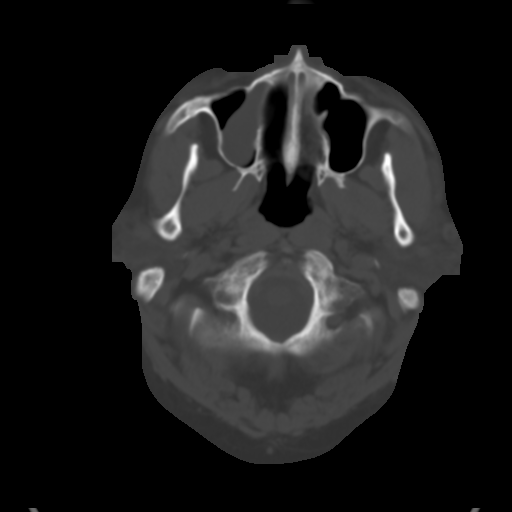
[im 4/32  brain]
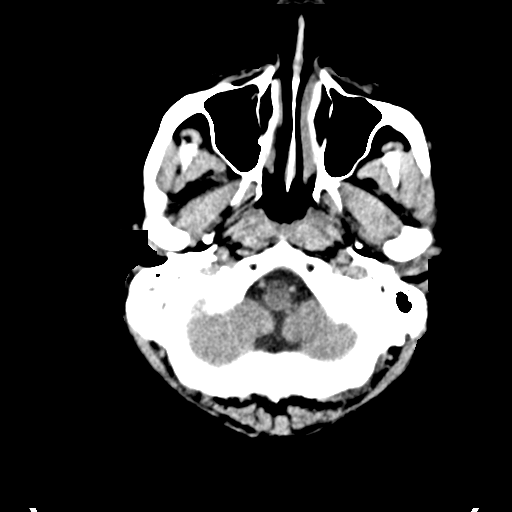
[im 6/32  brain]
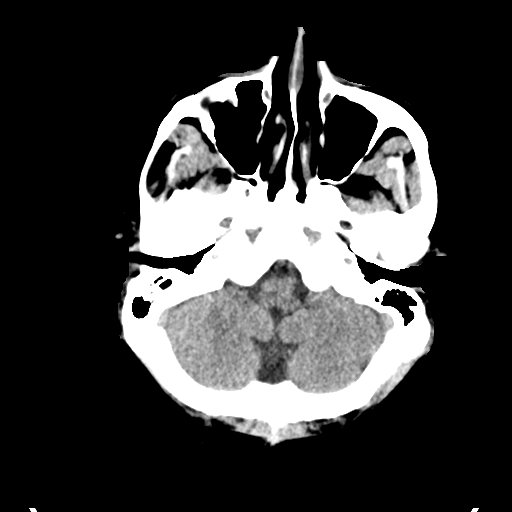
[im 8/32  brain]
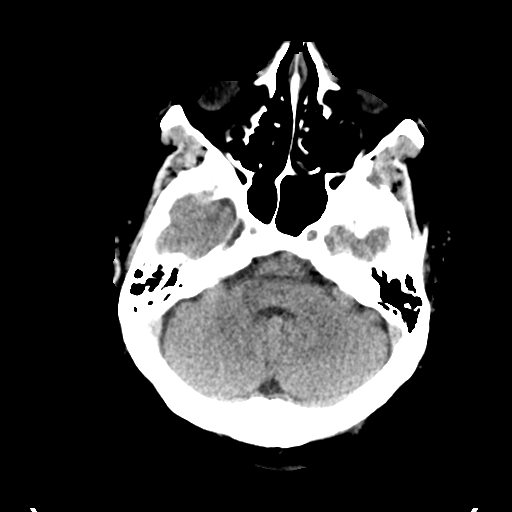
[im 10/32  brain]
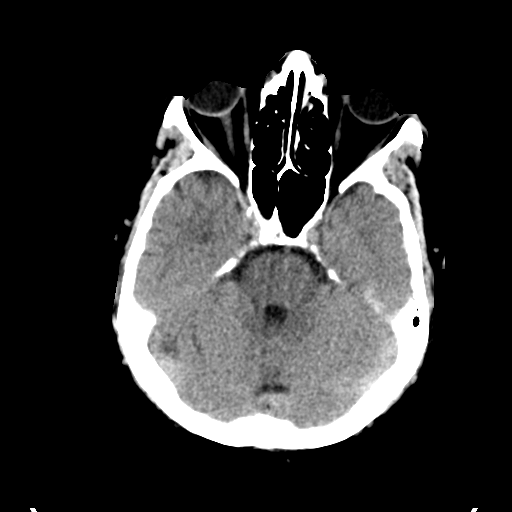
[im 10/32  bone]
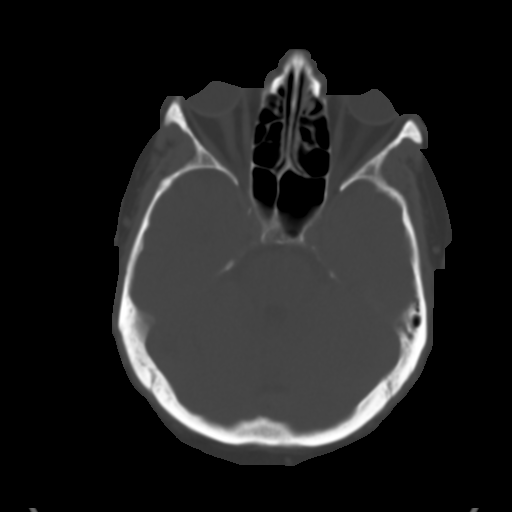
[im 12/32  brain]
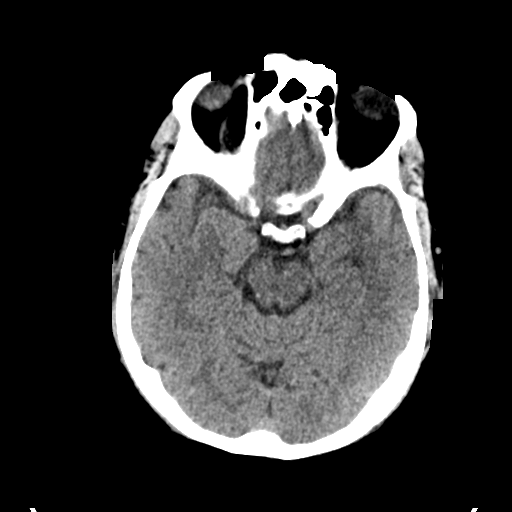
[im 14/32  brain]
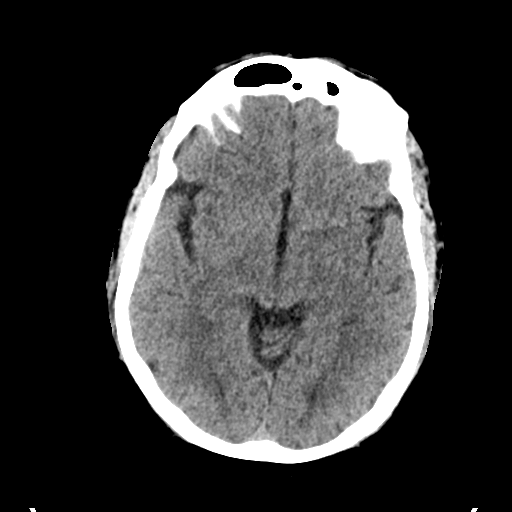
[im 17/32  brain]
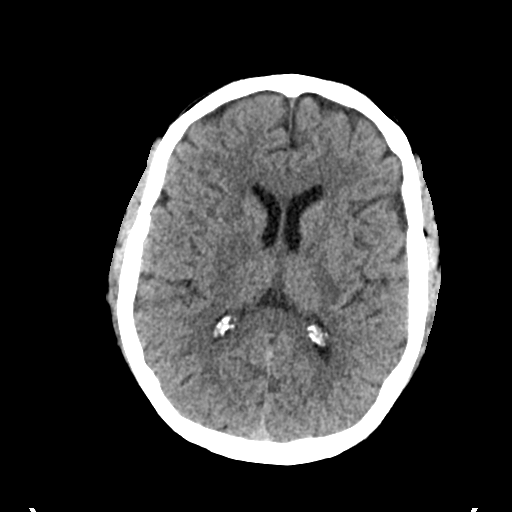
[im 18/32  brain]
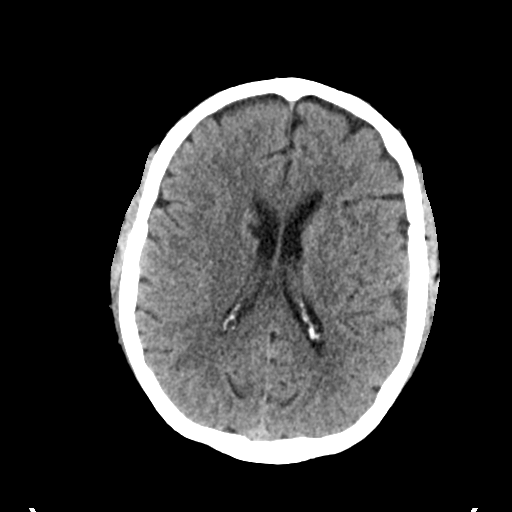
[im 18/32  bone]
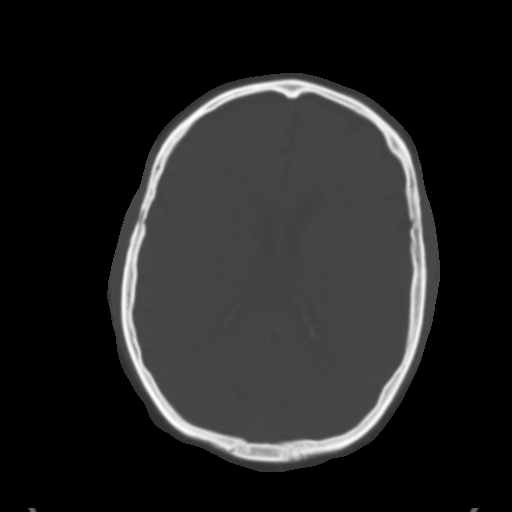
[im 20/32  brain]
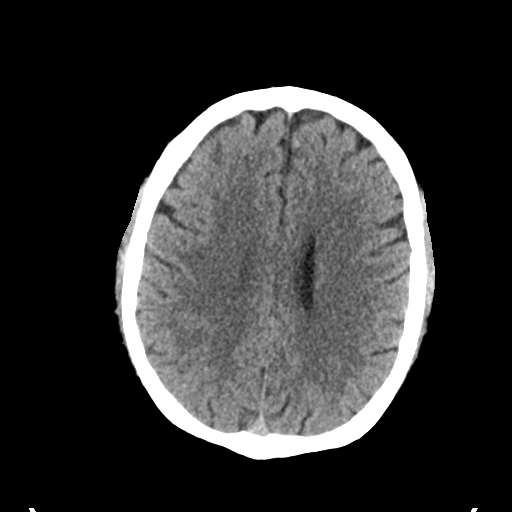
[im 22/32  brain]
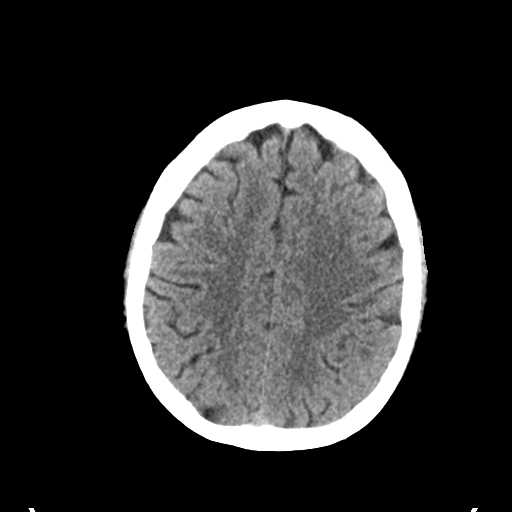
[im 24/32  brain]
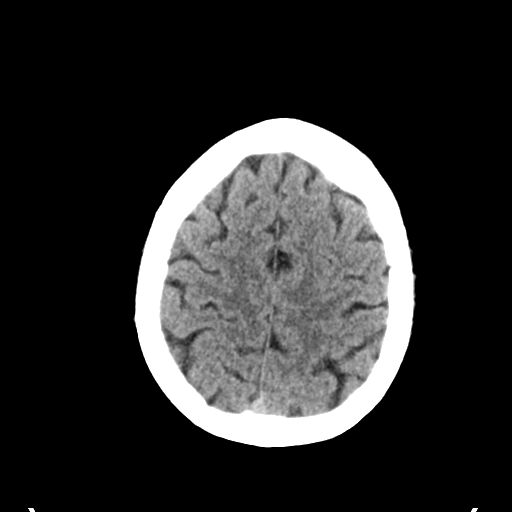
[im 26/32  brain]
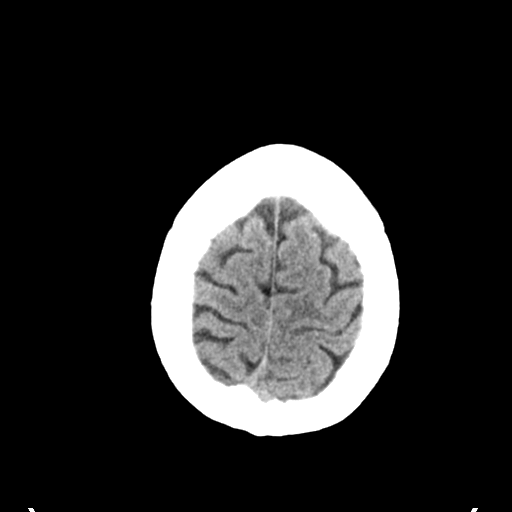
[im 26/32  bone]
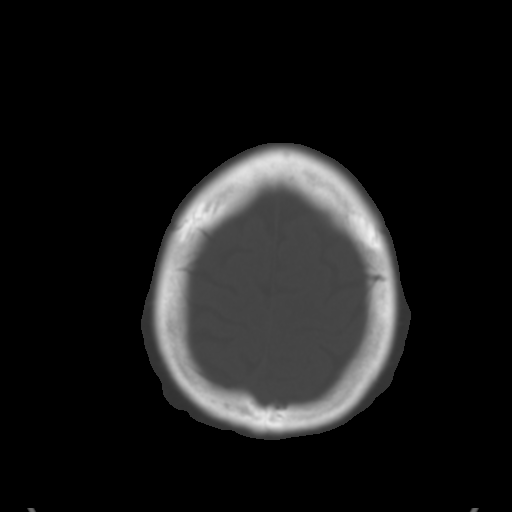
[im 28/32  brain]
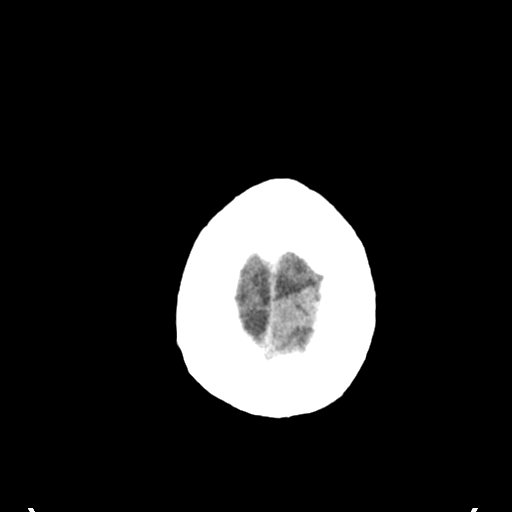
[im 30/32  brain]
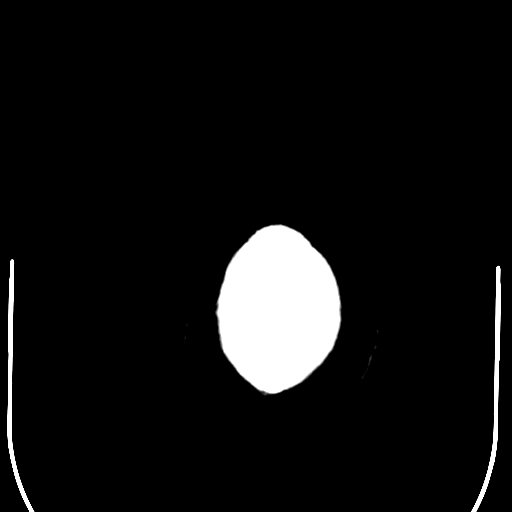

[15 of 30 positions shown; findings below may reference images not displayed]

FINDINGS: There is increased density in the left middle cerebral
artery (image number 13 series 3).  This is suspicious for
hyperdense MCA sign and thrombosis.  There are no secondary signs
of left MCA infarction.  Old left basal ganglia lacunar infarct is
present.  Gray-white differentiation along the insular ribbon
appears intact.  Old right caudate infarct is unchanged.  Posterior
fossa appears within normal limits.  No mass lesion, midline shift,
hydrocephalus or hemorrhage.  The calvarium is intact.  Chronic
right maxillary mucosal sinus disease.
IMPRESSION: High density in the left middle cerebral artery was probably
present on the prior examination 07/07/2012.  This may represent
partial volume averaging of a atherosclerosis or chronic hyperdense
left MCA sign.  There are no secondary signs of cerebral ischemia.
No definite acute intracranial abnormality.

Old small lacunar infarcts are unchanged.

Critical Value/emergent results were called by telephone at the
time of interpretation on 11/14/2012 at 2695 hours to [phone],
Dr. Susanaazevedo, who verbally acknowledged these results.

## 2015-06-04 DIAGNOSIS — E1165 Type 2 diabetes mellitus with hyperglycemia: Secondary | ICD-10-CM | POA: Diagnosis not present

## 2015-06-12 DIAGNOSIS — G4733 Obstructive sleep apnea (adult) (pediatric): Secondary | ICD-10-CM | POA: Diagnosis not present

## 2015-06-12 DIAGNOSIS — M654 Radial styloid tenosynovitis [de Quervain]: Secondary | ICD-10-CM | POA: Diagnosis not present

## 2015-06-12 DIAGNOSIS — Z23 Encounter for immunization: Secondary | ICD-10-CM | POA: Diagnosis not present

## 2015-06-12 DIAGNOSIS — I639 Cerebral infarction, unspecified: Secondary | ICD-10-CM | POA: Diagnosis not present

## 2015-06-12 DIAGNOSIS — E1165 Type 2 diabetes mellitus with hyperglycemia: Secondary | ICD-10-CM | POA: Diagnosis not present

## 2015-06-12 DIAGNOSIS — E039 Hypothyroidism, unspecified: Secondary | ICD-10-CM | POA: Diagnosis not present

## 2015-06-12 DIAGNOSIS — F3342 Major depressive disorder, recurrent, in full remission: Secondary | ICD-10-CM | POA: Diagnosis not present

## 2015-06-12 DIAGNOSIS — F411 Generalized anxiety disorder: Secondary | ICD-10-CM | POA: Diagnosis not present

## 2016-09-07 ENCOUNTER — Ambulatory Visit (INDEPENDENT_AMBULATORY_CARE_PROVIDER_SITE_OTHER): Payer: Medicare Other | Admitting: Otolaryngology

## 2016-09-07 DIAGNOSIS — H608X3 Other otitis externa, bilateral: Secondary | ICD-10-CM

## 2016-09-07 DIAGNOSIS — H903 Sensorineural hearing loss, bilateral: Secondary | ICD-10-CM

## 2016-11-09 ENCOUNTER — Ambulatory Visit (INDEPENDENT_AMBULATORY_CARE_PROVIDER_SITE_OTHER): Payer: Medicare Other | Admitting: Otolaryngology

## 2016-11-09 DIAGNOSIS — H903 Sensorineural hearing loss, bilateral: Secondary | ICD-10-CM | POA: Diagnosis not present

## 2016-11-09 DIAGNOSIS — H608X3 Other otitis externa, bilateral: Secondary | ICD-10-CM

## 2017-04-30 DIAGNOSIS — M25519 Pain in unspecified shoulder: Secondary | ICD-10-CM | POA: Diagnosis not present

## 2017-04-30 DIAGNOSIS — M7541 Impingement syndrome of right shoulder: Secondary | ICD-10-CM | POA: Diagnosis not present

## 2017-04-30 DIAGNOSIS — M19011 Primary osteoarthritis, right shoulder: Secondary | ICD-10-CM | POA: Diagnosis not present

## 2017-04-30 DIAGNOSIS — M25511 Pain in right shoulder: Secondary | ICD-10-CM | POA: Diagnosis not present

## 2017-04-30 DIAGNOSIS — M19019 Primary osteoarthritis, unspecified shoulder: Secondary | ICD-10-CM | POA: Diagnosis not present

## 2017-05-24 DIAGNOSIS — M25531 Pain in right wrist: Secondary | ICD-10-CM | POA: Diagnosis not present

## 2017-05-24 DIAGNOSIS — M19031 Primary osteoarthritis, right wrist: Secondary | ICD-10-CM | POA: Diagnosis not present

## 2017-07-07 DIAGNOSIS — K210 Gastro-esophageal reflux disease with esophagitis: Secondary | ICD-10-CM | POA: Diagnosis not present

## 2017-07-07 DIAGNOSIS — F411 Generalized anxiety disorder: Secondary | ICD-10-CM | POA: Diagnosis not present

## 2017-07-07 DIAGNOSIS — E7800 Pure hypercholesterolemia, unspecified: Secondary | ICD-10-CM | POA: Diagnosis not present

## 2017-07-07 DIAGNOSIS — I10 Essential (primary) hypertension: Secondary | ICD-10-CM | POA: Diagnosis not present

## 2017-07-07 DIAGNOSIS — E1122 Type 2 diabetes mellitus with diabetic chronic kidney disease: Secondary | ICD-10-CM | POA: Diagnosis not present

## 2017-07-07 DIAGNOSIS — E1165 Type 2 diabetes mellitus with hyperglycemia: Secondary | ICD-10-CM | POA: Diagnosis not present

## 2017-07-07 DIAGNOSIS — E039 Hypothyroidism, unspecified: Secondary | ICD-10-CM | POA: Diagnosis not present

## 2017-07-07 DIAGNOSIS — N183 Chronic kidney disease, stage 3 (moderate): Secondary | ICD-10-CM | POA: Diagnosis not present

## 2017-07-12 DIAGNOSIS — E1165 Type 2 diabetes mellitus with hyperglycemia: Secondary | ICD-10-CM | POA: Diagnosis not present

## 2017-07-12 DIAGNOSIS — I10 Essential (primary) hypertension: Secondary | ICD-10-CM | POA: Diagnosis not present

## 2017-07-12 DIAGNOSIS — Z6833 Body mass index (BMI) 33.0-33.9, adult: Secondary | ICD-10-CM | POA: Diagnosis not present

## 2017-07-12 DIAGNOSIS — E1122 Type 2 diabetes mellitus with diabetic chronic kidney disease: Secondary | ICD-10-CM | POA: Diagnosis not present

## 2017-07-12 DIAGNOSIS — N183 Chronic kidney disease, stage 3 (moderate): Secondary | ICD-10-CM | POA: Diagnosis not present

## 2017-07-12 DIAGNOSIS — E6609 Other obesity due to excess calories: Secondary | ICD-10-CM | POA: Diagnosis not present

## 2017-07-12 DIAGNOSIS — N529 Male erectile dysfunction, unspecified: Secondary | ICD-10-CM | POA: Diagnosis not present

## 2017-07-12 DIAGNOSIS — E039 Hypothyroidism, unspecified: Secondary | ICD-10-CM | POA: Diagnosis not present

## 2017-10-04 DIAGNOSIS — J441 Chronic obstructive pulmonary disease with (acute) exacerbation: Secondary | ICD-10-CM | POA: Diagnosis not present

## 2017-10-04 DIAGNOSIS — Z6833 Body mass index (BMI) 33.0-33.9, adult: Secondary | ICD-10-CM | POA: Diagnosis not present

## 2017-10-25 DIAGNOSIS — I10 Essential (primary) hypertension: Secondary | ICD-10-CM | POA: Diagnosis not present

## 2017-10-25 DIAGNOSIS — E039 Hypothyroidism, unspecified: Secondary | ICD-10-CM | POA: Diagnosis not present

## 2017-10-25 DIAGNOSIS — J441 Chronic obstructive pulmonary disease with (acute) exacerbation: Secondary | ICD-10-CM | POA: Diagnosis not present

## 2017-10-25 DIAGNOSIS — N183 Chronic kidney disease, stage 3 (moderate): Secondary | ICD-10-CM | POA: Diagnosis not present

## 2017-10-25 DIAGNOSIS — E1122 Type 2 diabetes mellitus with diabetic chronic kidney disease: Secondary | ICD-10-CM | POA: Diagnosis not present

## 2017-10-25 DIAGNOSIS — Z6833 Body mass index (BMI) 33.0-33.9, adult: Secondary | ICD-10-CM | POA: Diagnosis not present

## 2017-10-25 DIAGNOSIS — E1165 Type 2 diabetes mellitus with hyperglycemia: Secondary | ICD-10-CM | POA: Diagnosis not present

## 2017-10-25 DIAGNOSIS — G4733 Obstructive sleep apnea (adult) (pediatric): Secondary | ICD-10-CM | POA: Diagnosis not present

## 2017-11-09 DIAGNOSIS — E7801 Familial hypercholesterolemia: Secondary | ICD-10-CM | POA: Diagnosis not present

## 2017-11-09 DIAGNOSIS — E1165 Type 2 diabetes mellitus with hyperglycemia: Secondary | ICD-10-CM | POA: Diagnosis not present

## 2017-11-09 DIAGNOSIS — E1122 Type 2 diabetes mellitus with diabetic chronic kidney disease: Secondary | ICD-10-CM | POA: Diagnosis not present

## 2017-11-09 DIAGNOSIS — J441 Chronic obstructive pulmonary disease with (acute) exacerbation: Secondary | ICD-10-CM | POA: Diagnosis not present

## 2017-11-09 DIAGNOSIS — K210 Gastro-esophageal reflux disease with esophagitis: Secondary | ICD-10-CM | POA: Diagnosis not present

## 2017-11-09 DIAGNOSIS — G4733 Obstructive sleep apnea (adult) (pediatric): Secondary | ICD-10-CM | POA: Diagnosis not present

## 2017-11-09 DIAGNOSIS — N183 Chronic kidney disease, stage 3 (moderate): Secondary | ICD-10-CM | POA: Diagnosis not present

## 2017-11-09 DIAGNOSIS — E039 Hypothyroidism, unspecified: Secondary | ICD-10-CM | POA: Diagnosis not present

## 2017-11-09 DIAGNOSIS — I10 Essential (primary) hypertension: Secondary | ICD-10-CM | POA: Diagnosis not present

## 2017-11-10 DIAGNOSIS — E119 Type 2 diabetes mellitus without complications: Secondary | ICD-10-CM | POA: Diagnosis not present

## 2017-11-10 DIAGNOSIS — Z961 Presence of intraocular lens: Secondary | ICD-10-CM | POA: Diagnosis not present

## 2017-11-10 DIAGNOSIS — H35362 Drusen (degenerative) of macula, left eye: Secondary | ICD-10-CM | POA: Diagnosis not present

## 2017-11-10 DIAGNOSIS — H35033 Hypertensive retinopathy, bilateral: Secondary | ICD-10-CM | POA: Diagnosis not present

## 2017-11-11 DIAGNOSIS — N529 Male erectile dysfunction, unspecified: Secondary | ICD-10-CM | POA: Diagnosis not present

## 2017-11-11 DIAGNOSIS — E1122 Type 2 diabetes mellitus with diabetic chronic kidney disease: Secondary | ICD-10-CM | POA: Diagnosis not present

## 2017-11-11 DIAGNOSIS — N183 Chronic kidney disease, stage 3 (moderate): Secondary | ICD-10-CM | POA: Diagnosis not present

## 2017-11-11 DIAGNOSIS — E039 Hypothyroidism, unspecified: Secondary | ICD-10-CM | POA: Diagnosis not present

## 2017-11-11 DIAGNOSIS — Z0000 Encounter for general adult medical examination without abnormal findings: Secondary | ICD-10-CM | POA: Diagnosis not present

## 2017-11-11 DIAGNOSIS — Z6833 Body mass index (BMI) 33.0-33.9, adult: Secondary | ICD-10-CM | POA: Diagnosis not present

## 2017-11-11 DIAGNOSIS — E7800 Pure hypercholesterolemia, unspecified: Secondary | ICD-10-CM | POA: Diagnosis not present

## 2017-11-11 DIAGNOSIS — E1165 Type 2 diabetes mellitus with hyperglycemia: Secondary | ICD-10-CM | POA: Diagnosis not present

## 2017-11-11 DIAGNOSIS — I10 Essential (primary) hypertension: Secondary | ICD-10-CM | POA: Diagnosis not present

## 2017-11-22 DIAGNOSIS — M19031 Primary osteoarthritis, right wrist: Secondary | ICD-10-CM | POA: Diagnosis not present

## 2017-11-22 DIAGNOSIS — M24131 Other articular cartilage disorders, right wrist: Secondary | ICD-10-CM | POA: Diagnosis not present

## 2017-11-22 DIAGNOSIS — G8918 Other acute postprocedural pain: Secondary | ICD-10-CM | POA: Diagnosis not present

## 2017-12-06 DIAGNOSIS — Z4789 Encounter for other orthopedic aftercare: Secondary | ICD-10-CM | POA: Diagnosis not present

## 2017-12-06 DIAGNOSIS — M25531 Pain in right wrist: Secondary | ICD-10-CM | POA: Diagnosis not present

## 2017-12-21 DIAGNOSIS — Z4789 Encounter for other orthopedic aftercare: Secondary | ICD-10-CM | POA: Diagnosis not present

## 2017-12-21 DIAGNOSIS — M25531 Pain in right wrist: Secondary | ICD-10-CM | POA: Diagnosis not present

## 2018-01-14 DIAGNOSIS — Z6833 Body mass index (BMI) 33.0-33.9, adult: Secondary | ICD-10-CM | POA: Diagnosis not present

## 2018-01-14 DIAGNOSIS — R972 Elevated prostate specific antigen [PSA]: Secondary | ICD-10-CM | POA: Diagnosis not present

## 2018-01-14 DIAGNOSIS — R361 Hematospermia: Secondary | ICD-10-CM | POA: Diagnosis not present

## 2018-01-18 DIAGNOSIS — M25531 Pain in right wrist: Secondary | ICD-10-CM | POA: Diagnosis not present

## 2018-01-18 DIAGNOSIS — Z4789 Encounter for other orthopedic aftercare: Secondary | ICD-10-CM | POA: Diagnosis not present

## 2018-02-15 DIAGNOSIS — M25531 Pain in right wrist: Secondary | ICD-10-CM | POA: Diagnosis not present

## 2018-02-15 DIAGNOSIS — Z4789 Encounter for other orthopedic aftercare: Secondary | ICD-10-CM | POA: Diagnosis not present

## 2018-03-01 DIAGNOSIS — E1165 Type 2 diabetes mellitus with hyperglycemia: Secondary | ICD-10-CM | POA: Diagnosis not present

## 2018-03-01 DIAGNOSIS — F411 Generalized anxiety disorder: Secondary | ICD-10-CM | POA: Diagnosis not present

## 2018-03-01 DIAGNOSIS — R361 Hematospermia: Secondary | ICD-10-CM | POA: Diagnosis not present

## 2018-03-01 DIAGNOSIS — N183 Chronic kidney disease, stage 3 (moderate): Secondary | ICD-10-CM | POA: Diagnosis not present

## 2018-03-01 DIAGNOSIS — E039 Hypothyroidism, unspecified: Secondary | ICD-10-CM | POA: Diagnosis not present

## 2018-03-01 DIAGNOSIS — I10 Essential (primary) hypertension: Secondary | ICD-10-CM | POA: Diagnosis not present

## 2018-03-01 DIAGNOSIS — E7800 Pure hypercholesterolemia, unspecified: Secondary | ICD-10-CM | POA: Diagnosis not present

## 2018-03-01 DIAGNOSIS — K210 Gastro-esophageal reflux disease with esophagitis: Secondary | ICD-10-CM | POA: Diagnosis not present

## 2018-03-04 DIAGNOSIS — E1122 Type 2 diabetes mellitus with diabetic chronic kidney disease: Secondary | ICD-10-CM | POA: Diagnosis not present

## 2018-03-04 DIAGNOSIS — N183 Chronic kidney disease, stage 3 (moderate): Secondary | ICD-10-CM | POA: Diagnosis not present

## 2018-03-04 DIAGNOSIS — E1165 Type 2 diabetes mellitus with hyperglycemia: Secondary | ICD-10-CM | POA: Diagnosis not present

## 2018-03-04 DIAGNOSIS — Z6833 Body mass index (BMI) 33.0-33.9, adult: Secondary | ICD-10-CM | POA: Diagnosis not present

## 2018-03-04 DIAGNOSIS — E7800 Pure hypercholesterolemia, unspecified: Secondary | ICD-10-CM | POA: Diagnosis not present

## 2018-03-04 DIAGNOSIS — I10 Essential (primary) hypertension: Secondary | ICD-10-CM | POA: Diagnosis not present

## 2018-03-04 DIAGNOSIS — N529 Male erectile dysfunction, unspecified: Secondary | ICD-10-CM | POA: Diagnosis not present

## 2018-03-04 DIAGNOSIS — E039 Hypothyroidism, unspecified: Secondary | ICD-10-CM | POA: Diagnosis not present

## 2018-06-28 DIAGNOSIS — E039 Hypothyroidism, unspecified: Secondary | ICD-10-CM | POA: Diagnosis not present

## 2018-06-28 DIAGNOSIS — K210 Gastro-esophageal reflux disease with esophagitis: Secondary | ICD-10-CM | POA: Diagnosis not present

## 2018-06-28 DIAGNOSIS — E7801 Familial hypercholesterolemia: Secondary | ICD-10-CM | POA: Diagnosis not present

## 2018-06-28 DIAGNOSIS — I10 Essential (primary) hypertension: Secondary | ICD-10-CM | POA: Diagnosis not present

## 2018-06-28 DIAGNOSIS — E1122 Type 2 diabetes mellitus with diabetic chronic kidney disease: Secondary | ICD-10-CM | POA: Diagnosis not present

## 2018-06-28 DIAGNOSIS — E1165 Type 2 diabetes mellitus with hyperglycemia: Secondary | ICD-10-CM | POA: Diagnosis not present

## 2018-06-28 DIAGNOSIS — E7800 Pure hypercholesterolemia, unspecified: Secondary | ICD-10-CM | POA: Diagnosis not present

## 2018-06-30 DIAGNOSIS — N529 Male erectile dysfunction, unspecified: Secondary | ICD-10-CM | POA: Diagnosis not present

## 2018-06-30 DIAGNOSIS — Z6833 Body mass index (BMI) 33.0-33.9, adult: Secondary | ICD-10-CM | POA: Diagnosis not present

## 2018-06-30 DIAGNOSIS — E1122 Type 2 diabetes mellitus with diabetic chronic kidney disease: Secondary | ICD-10-CM | POA: Diagnosis not present

## 2018-06-30 DIAGNOSIS — E039 Hypothyroidism, unspecified: Secondary | ICD-10-CM | POA: Diagnosis not present

## 2018-06-30 DIAGNOSIS — N183 Chronic kidney disease, stage 3 (moderate): Secondary | ICD-10-CM | POA: Diagnosis not present

## 2018-06-30 DIAGNOSIS — I10 Essential (primary) hypertension: Secondary | ICD-10-CM | POA: Diagnosis not present

## 2018-06-30 DIAGNOSIS — E7800 Pure hypercholesterolemia, unspecified: Secondary | ICD-10-CM | POA: Diagnosis not present

## 2018-06-30 DIAGNOSIS — E1165 Type 2 diabetes mellitus with hyperglycemia: Secondary | ICD-10-CM | POA: Diagnosis not present

## 2018-07-04 DIAGNOSIS — R319 Hematuria, unspecified: Secondary | ICD-10-CM | POA: Diagnosis not present

## 2018-07-04 DIAGNOSIS — Z6833 Body mass index (BMI) 33.0-33.9, adult: Secondary | ICD-10-CM | POA: Diagnosis not present

## 2019-07-03 DIAGNOSIS — E1122 Type 2 diabetes mellitus with diabetic chronic kidney disease: Secondary | ICD-10-CM | POA: Diagnosis not present

## 2019-07-03 DIAGNOSIS — E782 Mixed hyperlipidemia: Secondary | ICD-10-CM | POA: Diagnosis not present

## 2019-07-03 DIAGNOSIS — K2100 Gastro-esophageal reflux disease with esophagitis, without bleeding: Secondary | ICD-10-CM | POA: Diagnosis not present

## 2019-07-03 DIAGNOSIS — I10 Essential (primary) hypertension: Secondary | ICD-10-CM | POA: Diagnosis not present

## 2019-07-07 DIAGNOSIS — Z23 Encounter for immunization: Secondary | ICD-10-CM | POA: Diagnosis not present

## 2019-07-07 DIAGNOSIS — E782 Mixed hyperlipidemia: Secondary | ICD-10-CM | POA: Diagnosis not present

## 2019-07-07 DIAGNOSIS — I10 Essential (primary) hypertension: Secondary | ICD-10-CM | POA: Diagnosis not present

## 2019-07-07 DIAGNOSIS — E1122 Type 2 diabetes mellitus with diabetic chronic kidney disease: Secondary | ICD-10-CM | POA: Diagnosis not present

## 2019-07-07 DIAGNOSIS — E1165 Type 2 diabetes mellitus with hyperglycemia: Secondary | ICD-10-CM | POA: Diagnosis not present

## 2019-07-07 DIAGNOSIS — F3342 Major depressive disorder, recurrent, in full remission: Secondary | ICD-10-CM | POA: Diagnosis not present

## 2019-07-07 DIAGNOSIS — Z6833 Body mass index (BMI) 33.0-33.9, adult: Secondary | ICD-10-CM | POA: Diagnosis not present

## 2019-11-06 DIAGNOSIS — I10 Essential (primary) hypertension: Secondary | ICD-10-CM | POA: Diagnosis not present

## 2019-11-06 DIAGNOSIS — E782 Mixed hyperlipidemia: Secondary | ICD-10-CM | POA: Diagnosis not present

## 2019-11-06 DIAGNOSIS — N183 Chronic kidney disease, stage 3 unspecified: Secondary | ICD-10-CM | POA: Diagnosis not present

## 2019-11-06 DIAGNOSIS — E1122 Type 2 diabetes mellitus with diabetic chronic kidney disease: Secondary | ICD-10-CM | POA: Diagnosis not present

## 2019-11-06 DIAGNOSIS — K2100 Gastro-esophageal reflux disease with esophagitis, without bleeding: Secondary | ICD-10-CM | POA: Diagnosis not present

## 2019-11-08 DIAGNOSIS — Z6832 Body mass index (BMI) 32.0-32.9, adult: Secondary | ICD-10-CM | POA: Diagnosis not present

## 2019-11-08 DIAGNOSIS — E1122 Type 2 diabetes mellitus with diabetic chronic kidney disease: Secondary | ICD-10-CM | POA: Diagnosis not present

## 2019-11-08 DIAGNOSIS — J449 Chronic obstructive pulmonary disease, unspecified: Secondary | ICD-10-CM | POA: Diagnosis not present

## 2019-11-08 DIAGNOSIS — E1165 Type 2 diabetes mellitus with hyperglycemia: Secondary | ICD-10-CM | POA: Diagnosis not present

## 2019-11-08 DIAGNOSIS — G4733 Obstructive sleep apnea (adult) (pediatric): Secondary | ICD-10-CM | POA: Diagnosis not present

## 2019-11-08 DIAGNOSIS — I10 Essential (primary) hypertension: Secondary | ICD-10-CM | POA: Diagnosis not present

## 2019-11-08 DIAGNOSIS — F3342 Major depressive disorder, recurrent, in full remission: Secondary | ICD-10-CM | POA: Diagnosis not present

## 2019-11-08 DIAGNOSIS — E782 Mixed hyperlipidemia: Secondary | ICD-10-CM | POA: Diagnosis not present

## 2019-11-16 DIAGNOSIS — H04123 Dry eye syndrome of bilateral lacrimal glands: Secondary | ICD-10-CM | POA: Diagnosis not present

## 2019-11-16 DIAGNOSIS — H35033 Hypertensive retinopathy, bilateral: Secondary | ICD-10-CM | POA: Diagnosis not present

## 2019-11-16 DIAGNOSIS — Z961 Presence of intraocular lens: Secondary | ICD-10-CM | POA: Diagnosis not present

## 2019-11-16 DIAGNOSIS — E119 Type 2 diabetes mellitus without complications: Secondary | ICD-10-CM | POA: Diagnosis not present

## 2020-03-13 DIAGNOSIS — K2100 Gastro-esophageal reflux disease with esophagitis, without bleeding: Secondary | ICD-10-CM | POA: Diagnosis not present

## 2020-03-13 DIAGNOSIS — N183 Chronic kidney disease, stage 3 unspecified: Secondary | ICD-10-CM | POA: Diagnosis not present

## 2020-03-13 DIAGNOSIS — E039 Hypothyroidism, unspecified: Secondary | ICD-10-CM | POA: Diagnosis not present

## 2020-03-13 DIAGNOSIS — E1122 Type 2 diabetes mellitus with diabetic chronic kidney disease: Secondary | ICD-10-CM | POA: Diagnosis not present

## 2020-03-15 DIAGNOSIS — Z0001 Encounter for general adult medical examination with abnormal findings: Secondary | ICD-10-CM | POA: Diagnosis not present

## 2020-03-15 DIAGNOSIS — J449 Chronic obstructive pulmonary disease, unspecified: Secondary | ICD-10-CM | POA: Diagnosis not present

## 2020-03-15 DIAGNOSIS — Z23 Encounter for immunization: Secondary | ICD-10-CM | POA: Diagnosis not present

## 2020-03-15 DIAGNOSIS — E1165 Type 2 diabetes mellitus with hyperglycemia: Secondary | ICD-10-CM | POA: Diagnosis not present

## 2020-03-15 DIAGNOSIS — I10 Essential (primary) hypertension: Secondary | ICD-10-CM | POA: Diagnosis not present

## 2020-03-15 DIAGNOSIS — E1122 Type 2 diabetes mellitus with diabetic chronic kidney disease: Secondary | ICD-10-CM | POA: Diagnosis not present

## 2020-03-15 DIAGNOSIS — Z6833 Body mass index (BMI) 33.0-33.9, adult: Secondary | ICD-10-CM | POA: Diagnosis not present

## 2020-03-15 DIAGNOSIS — E782 Mixed hyperlipidemia: Secondary | ICD-10-CM | POA: Diagnosis not present

## 2020-04-02 DIAGNOSIS — G4733 Obstructive sleep apnea (adult) (pediatric): Secondary | ICD-10-CM | POA: Diagnosis not present

## 2020-06-19 ENCOUNTER — Other Ambulatory Visit: Payer: Self-pay

## 2020-06-19 ENCOUNTER — Emergency Department (HOSPITAL_COMMUNITY): Payer: Medicare PPO

## 2020-06-19 ENCOUNTER — Inpatient Hospital Stay (HOSPITAL_COMMUNITY)
Admission: EM | Admit: 2020-06-19 | Discharge: 2020-06-21 | DRG: 177 | Disposition: A | Discharge status: Home / Self Care | Payer: Medicare PPO | Attending: Internal Medicine | Admitting: Internal Medicine

## 2020-06-19 ENCOUNTER — Encounter (HOSPITAL_COMMUNITY): Payer: Self-pay | Admitting: Emergency Medicine

## 2020-06-19 DIAGNOSIS — F32A Depression, unspecified: Secondary | ICD-10-CM | POA: Diagnosis not present

## 2020-06-19 DIAGNOSIS — J9601 Acute respiratory failure with hypoxia: Secondary | ICD-10-CM | POA: Diagnosis present

## 2020-06-19 DIAGNOSIS — Z7982 Long term (current) use of aspirin: Secondary | ICD-10-CM

## 2020-06-19 DIAGNOSIS — E119 Type 2 diabetes mellitus without complications: Secondary | ICD-10-CM

## 2020-06-19 DIAGNOSIS — Z885 Allergy status to narcotic agent status: Secondary | ICD-10-CM

## 2020-06-19 DIAGNOSIS — M1990 Unspecified osteoarthritis, unspecified site: Secondary | ICD-10-CM | POA: Diagnosis present

## 2020-06-19 DIAGNOSIS — K219 Gastro-esophageal reflux disease without esophagitis: Secondary | ICD-10-CM | POA: Diagnosis present

## 2020-06-19 DIAGNOSIS — Z87891 Personal history of nicotine dependence: Secondary | ICD-10-CM | POA: Diagnosis not present

## 2020-06-19 DIAGNOSIS — E785 Hyperlipidemia, unspecified: Secondary | ICD-10-CM | POA: Diagnosis present

## 2020-06-19 DIAGNOSIS — F419 Anxiety disorder, unspecified: Secondary | ICD-10-CM | POA: Diagnosis present

## 2020-06-19 DIAGNOSIS — R0902 Hypoxemia: Secondary | ICD-10-CM

## 2020-06-19 DIAGNOSIS — G4733 Obstructive sleep apnea (adult) (pediatric): Secondary | ICD-10-CM | POA: Diagnosis present

## 2020-06-19 DIAGNOSIS — R059 Cough, unspecified: Secondary | ICD-10-CM | POA: Diagnosis not present

## 2020-06-19 DIAGNOSIS — E1165 Type 2 diabetes mellitus with hyperglycemia: Secondary | ICD-10-CM | POA: Diagnosis not present

## 2020-06-19 DIAGNOSIS — R0602 Shortness of breath: Secondary | ICD-10-CM | POA: Diagnosis not present

## 2020-06-19 DIAGNOSIS — Z79890 Hormone replacement therapy (postmenopausal): Secondary | ICD-10-CM

## 2020-06-19 DIAGNOSIS — Z6832 Body mass index (BMI) 32.0-32.9, adult: Secondary | ICD-10-CM

## 2020-06-19 DIAGNOSIS — U071 COVID-19: Secondary | ICD-10-CM | POA: Diagnosis not present

## 2020-06-19 DIAGNOSIS — Z79899 Other long term (current) drug therapy: Secondary | ICD-10-CM

## 2020-06-19 DIAGNOSIS — E039 Hypothyroidism, unspecified: Secondary | ICD-10-CM | POA: Diagnosis present

## 2020-06-19 DIAGNOSIS — I10 Essential (primary) hypertension: Secondary | ICD-10-CM | POA: Diagnosis not present

## 2020-06-19 DIAGNOSIS — D696 Thrombocytopenia, unspecified: Secondary | ICD-10-CM | POA: Diagnosis not present

## 2020-06-19 DIAGNOSIS — A0839 Other viral enteritis: Secondary | ICD-10-CM | POA: Diagnosis present

## 2020-06-19 DIAGNOSIS — J1282 Pneumonia due to coronavirus disease 2019: Secondary | ICD-10-CM | POA: Diagnosis present

## 2020-06-19 DIAGNOSIS — Z888 Allergy status to other drugs, medicaments and biological substances status: Secondary | ICD-10-CM

## 2020-06-19 DIAGNOSIS — J9811 Atelectasis: Secondary | ICD-10-CM | POA: Diagnosis not present

## 2020-06-19 DIAGNOSIS — T380X5A Adverse effect of glucocorticoids and synthetic analogues, initial encounter: Secondary | ICD-10-CM | POA: Diagnosis not present

## 2020-06-19 DIAGNOSIS — Z8673 Personal history of transient ischemic attack (TIA), and cerebral infarction without residual deficits: Secondary | ICD-10-CM | POA: Diagnosis not present

## 2020-06-19 DIAGNOSIS — J189 Pneumonia, unspecified organism: Secondary | ICD-10-CM | POA: Diagnosis not present

## 2020-06-19 DIAGNOSIS — E669 Obesity, unspecified: Secondary | ICD-10-CM | POA: Diagnosis present

## 2020-06-19 HISTORY — DX: Type 2 diabetes mellitus without complications: E11.9

## 2020-06-19 LAB — CBC WITH DIFFERENTIAL/PLATELET
Abs Immature Granulocytes: 0.06 10*3/uL (ref 0.00–0.07)
Basophils Absolute: 0 10*3/uL (ref 0.0–0.1)
Basophils Relative: 1 %
Eosinophils Absolute: 0 10*3/uL (ref 0.0–0.5)
Eosinophils Relative: 0 %
HCT: 41.9 % (ref 39.0–52.0)
Hemoglobin: 13.6 g/dL (ref 13.0–17.0)
Immature Granulocytes: 2 %
Lymphocytes Relative: 13 %
Lymphs Abs: 0.5 10*3/uL — ABNORMAL LOW (ref 0.7–4.0)
MCH: 28 pg (ref 26.0–34.0)
MCHC: 32.5 g/dL (ref 30.0–36.0)
MCV: 86.2 fL (ref 80.0–100.0)
Monocytes Absolute: 0.2 10*3/uL (ref 0.1–1.0)
Monocytes Relative: 5 %
Neutro Abs: 3.3 10*3/uL (ref 1.7–7.7)
Neutrophils Relative %: 79 %
Platelets: 120 10*3/uL — ABNORMAL LOW (ref 150–400)
RBC: 4.86 MIL/uL (ref 4.22–5.81)
RDW: 14 % (ref 11.5–15.5)
WBC: 4.1 10*3/uL (ref 4.0–10.5)
nRBC: 0 % (ref 0.0–0.2)

## 2020-06-19 LAB — COMPREHENSIVE METABOLIC PANEL
ALT: 35 U/L (ref 0–44)
AST: 36 U/L (ref 15–41)
Albumin: 3.4 g/dL — ABNORMAL LOW (ref 3.5–5.0)
Alkaline Phosphatase: 75 U/L (ref 38–126)
Anion gap: 10 (ref 5–15)
BUN: 24 mg/dL — ABNORMAL HIGH (ref 8–23)
CO2: 24 mmol/L (ref 22–32)
Calcium: 8.5 mg/dL — ABNORMAL LOW (ref 8.9–10.3)
Chloride: 103 mmol/L (ref 98–111)
Creatinine, Ser: 1.03 mg/dL (ref 0.61–1.24)
GFR, Estimated: >60 mL/min (ref >60)
Glucose, Bld: 176 mg/dL — ABNORMAL HIGH (ref 70–99)
Potassium: 3.5 mmol/L (ref 3.5–5.1)
Sodium: 137 mmol/L (ref 135–145)
Total Bilirubin: 0.8 mg/dL (ref 0.3–1.2)
Total Protein: 6.7 g/dL (ref 6.5–8.1)

## 2020-06-19 LAB — FERRITIN: Ferritin: 467 ng/mL — ABNORMAL HIGH (ref 24–336)

## 2020-06-19 LAB — LACTIC ACID, PLASMA
Lactic Acid, Venous: 1.4 mmol/L (ref 0.5–1.9)
Lactic Acid, Venous: 1.1 mmol/L (ref 0.5–1.9)

## 2020-06-19 LAB — CBG MONITORING, ED
Glucose-Capillary: 159 mg/dL — ABNORMAL HIGH (ref 70–99)
Glucose-Capillary: 175 mg/dL — ABNORMAL HIGH (ref 70–99)
Glucose-Capillary: 145 mg/dL — ABNORMAL HIGH (ref 70–99)

## 2020-06-19 LAB — RESP PANEL BY RT-PCR (FLU A&B, COVID) ARPGX2
Influenza A by PCR: NEGATIVE
Influenza B by PCR: NEGATIVE
SARS Coronavirus 2 by RT PCR: POSITIVE — AB

## 2020-06-19 LAB — D-DIMER, QUANTITATIVE: D-Dimer, Quant: 2.06 ug/mL-FEU — ABNORMAL HIGH (ref 0.00–0.50)

## 2020-06-19 LAB — CREATININE, SERUM
Creatinine, Ser: 0.98 mg/dL (ref 0.61–1.24)
GFR, Estimated: >60 mL/min (ref >60)

## 2020-06-19 LAB — FIBRINOGEN: Fibrinogen: 519 mg/dL — ABNORMAL HIGH (ref 210–475)

## 2020-06-19 LAB — PROCALCITONIN: Procalcitonin: <0.1 ng/mL

## 2020-06-19 LAB — HIV ANTIBODY (ROUTINE TESTING W REFLEX): HIV Screen 4th Generation wRfx: NONREACTIVE

## 2020-06-19 LAB — C-REACTIVE PROTEIN: CRP: 3.7 mg/dL — ABNORMAL HIGH (ref <1.0)

## 2020-06-19 LAB — TRIGLYCERIDES: Triglycerides: 121 mg/dL (ref <150)

## 2020-06-19 LAB — LACTATE DEHYDROGENASE: LDH: 229 U/L — ABNORMAL HIGH (ref 98–192)

## 2020-06-19 MED ORDER — AMLODIPINE BESYLATE 5 MG PO TABS
5.0000 mg | ORAL_TABLET | Freq: Every day | ORAL | Status: DC
  Administered 2020-06-20 – 2020-06-21 (×2): 5 mg via ORAL
  Filled 2020-06-19 (×2): qty 1

## 2020-06-19 MED ORDER — CO-ENZYME Q-10 50 MG PO CAPS: 50.0000 mg | ORAL_CAPSULE | Freq: Every day | ORAL | Status: DC

## 2020-06-19 MED ORDER — ENOXAPARIN SODIUM 40 MG/0.4ML ~~LOC~~ SOLN
40.0000 mg | SUBCUTANEOUS | Status: DC
  Administered 2020-06-19 – 2020-06-20 (×2): 40 mg via SUBCUTANEOUS
  Filled 2020-06-19 (×2): qty 0.4

## 2020-06-19 MED ORDER — AMLODIPINE BESY-BENAZEPRIL HCL 5-10 MG PO CAPS: 1.0000 | ORAL_CAPSULE | Freq: Every day | ORAL | Status: DC

## 2020-06-19 MED ORDER — FLUTICASONE PROPIONATE 50 MCG/ACT NA SUSP: 2.0000 | NASAL | Status: DC | PRN

## 2020-06-19 MED ORDER — VITAMIN B-12 1000 MCG PO TABS
1000.0000 ug | ORAL_TABLET | Freq: Every day | ORAL | Status: DC
  Administered 2020-06-19 – 2020-06-21 (×3): 1000 ug via ORAL
  Filled 2020-06-19 (×2): qty 1

## 2020-06-19 MED ORDER — PANTOPRAZOLE SODIUM 40 MG PO TBEC
40.0000 mg | DELAYED_RELEASE_TABLET | Freq: Every day | ORAL | Status: DC
  Administered 2020-06-20 – 2020-06-21 (×2): 40 mg via ORAL
  Filled 2020-06-19 (×2): qty 1

## 2020-06-19 MED ORDER — INSULIN ASPART 100 UNIT/ML ~~LOC~~ SOLN
0.0000 [IU] | Freq: Three times a day (TID) | SUBCUTANEOUS | Status: DC
  Administered 2020-06-19 (×2): 3 [IU] via SUBCUTANEOUS
  Administered 2020-06-20 (×2): 5 [IU] via SUBCUTANEOUS
  Administered 2020-06-21: 3 [IU] via SUBCUTANEOUS
  Administered 2020-06-21: 2 [IU] via SUBCUTANEOUS
  Filled 2020-06-19 (×2): qty 1

## 2020-06-19 MED ORDER — METHYLPREDNISOLONE SODIUM SUCC 125 MG IJ SOLR
0.5000 mg/kg | Freq: Two times a day (BID) | INTRAMUSCULAR | Status: DC
  Administered 2020-06-19 – 2020-06-21 (×5): 40.625 mg via INTRAVENOUS
  Filled 2020-06-19 (×5): qty 2

## 2020-06-19 MED ORDER — ACETAMINOPHEN 325 MG PO TABS: 650.0000 mg | ORAL_TABLET | Freq: Four times a day (QID) | ORAL | Status: DC | PRN

## 2020-06-19 MED ORDER — INSULIN ASPART 100 UNIT/ML ~~LOC~~ SOLN
4.0000 [IU] | Freq: Three times a day (TID) | SUBCUTANEOUS | Status: DC
  Administered 2020-06-19 – 2020-06-21 (×6): 4 [IU] via SUBCUTANEOUS
  Filled 2020-06-19 (×4): qty 1

## 2020-06-19 MED ORDER — GUAIFENESIN-DM 100-10 MG/5ML PO SYRP
10.0000 mL | ORAL_SOLUTION | ORAL | Status: DC | PRN
  Administered 2020-06-19 – 2020-06-20 (×2): 10 mL via ORAL
  Filled 2020-06-19 (×2): qty 10

## 2020-06-19 MED ORDER — LEVOTHYROXINE SODIUM 50 MCG PO TABS
50.0000 ug | ORAL_TABLET | Freq: Every day | ORAL | Status: DC
  Administered 2020-06-20 – 2020-06-21 (×2): 50 ug via ORAL
  Filled 2020-06-19 (×2): qty 1

## 2020-06-19 MED ORDER — ASPIRIN EC 81 MG PO TBEC
81.0000 mg | DELAYED_RELEASE_TABLET | Freq: Every day | ORAL | Status: DC
  Administered 2020-06-20 – 2020-06-21 (×2): 81 mg via ORAL
  Filled 2020-06-19 (×2): qty 1

## 2020-06-19 MED ORDER — DEXAMETHASONE SODIUM PHOSPHATE 10 MG/ML IJ SOLN
6.0000 mg | Freq: Once | INTRAMUSCULAR | Status: AC
  Administered 2020-06-19: 6 mg via INTRAVENOUS
  Filled 2020-06-19: qty 1

## 2020-06-19 MED ORDER — BENAZEPRIL HCL 10 MG PO TABS
10.0000 mg | ORAL_TABLET | Freq: Every day | ORAL | Status: DC
  Administered 2020-06-20 – 2020-06-21 (×2): 10 mg via ORAL
  Filled 2020-06-19 (×2): qty 1

## 2020-06-19 MED ORDER — IOHEXOL 350 MG/ML SOLN
100.0000 mL | Freq: Once | INTRAVENOUS | Status: AC | PRN
  Administered 2020-06-19: 100 mL via INTRAVENOUS

## 2020-06-19 MED ORDER — SERTRALINE HCL 50 MG PO TABS
150.0000 mg | ORAL_TABLET | Freq: Every day | ORAL | Status: DC
  Administered 2020-06-19 – 2020-06-20 (×2): 150 mg via ORAL
  Filled 2020-06-19 (×2): qty 3

## 2020-06-19 MED ORDER — LINAGLIPTIN 5 MG PO TABS
5.0000 mg | ORAL_TABLET | Freq: Every day | ORAL | Status: DC
  Administered 2020-06-19 – 2020-06-21 (×3): 5 mg via ORAL
  Filled 2020-06-19 (×3): qty 1

## 2020-06-19 MED ORDER — INSULIN ASPART 100 UNIT/ML ~~LOC~~ SOLN
0.0000 [IU] | Freq: Every day | SUBCUTANEOUS | Status: DC
  Administered 2020-06-19: 2 [IU] via SUBCUTANEOUS
  Filled 2020-06-19: qty 1

## 2020-06-19 MED ORDER — PRAVASTATIN SODIUM 40 MG PO TABS
40.0000 mg | ORAL_TABLET | Freq: Every day | ORAL | Status: DC
  Administered 2020-06-19 – 2020-06-20 (×2): 40 mg via ORAL
  Filled 2020-06-19 (×2): qty 1

## 2020-06-19 MED ORDER — ALBUTEROL SULFATE HFA 108 (90 BASE) MCG/ACT IN AERS: 2.0000 | INHALATION_SPRAY | Freq: Four times a day (QID) | RESPIRATORY_TRACT | Status: DC | PRN

## 2020-06-19 MED ORDER — LORATADINE 10 MG PO TABS
10.0000 mg | ORAL_TABLET | Freq: Every day | ORAL | Status: DC
  Administered 2020-06-20 – 2020-06-21 (×2): 10 mg via ORAL
  Filled 2020-06-19 (×2): qty 1

## 2020-06-19 NOTE — Progress Notes (Signed)
Darlin Priestly, RN, that is charge in ED tonight and explained to her that patient is a covid patient and they are wanting him to be on CPAP.  Patient will have to be placed on a Servo Ventilator running in Non-invasive mode in order to fill this.  Will place patient on this machine tonight.

## 2020-06-19 NOTE — ED Provider Notes (Signed)
Patient with 2 weeks of symptoms of COVID-19.  Patient's Covid test here is positive.  Labs without significant abnormalities.  Does have an elevation in D-dimer which could be just secondary to the Covid.  But since he is 2 weeks into the symptoms pulmonary embolus is a possibility.  Patient does have an oxygen requirement here his oxygen sats on room air will go down to 88-89%.  Patient currently on 2 L of oxygen satting 94%.  Patient will require admission.  We will go ahead and order Decadron for him.  We will contact hospitalist service after the CT angio of the chest has been done to rule out PE or rule it in.  CRITICAL CARE Performed by: Vanetta Mulders Total critical care time: 35 minutes Critical care time was exclusive of separately billable procedures and treating other patients. Critical care was necessary to treat or prevent imminent or life-threatening deterioration. Critical care was time spent personally by me on the following activities: development of treatment plan with patient and/or surrogate as well as nursing, discussions with consultants, evaluation of patient's response to treatment, examination of patient, obtaining history from patient or surrogate, ordering and performing treatments and interventions, ordering and review of laboratory studies, ordering and review of radiographic studies, pulse oximetry and re-evaluation of patient's condition.    Vanetta Mulders, MD 06/19/20 [phone]

## 2020-06-19 NOTE — ED Triage Notes (Signed)
Pt diagnosed with covid 2 weeks ago. Pt c/o increased cough, sob, and overall weakness.

## 2020-06-19 NOTE — ED Notes (Signed)
Date and time results received: 06/19/20 0832 (use smartphrase ".now" to insert current time)  Test: COVID  Critical Value: +  Name of Provider Notified: Dr. Deretha Emory  Orders Received? Or Actions Taken?: see chart

## 2020-06-19 NOTE — ED Notes (Signed)
Pt to CT

## 2020-06-19 NOTE — H&P (Addendum)
History and Physical    Mathew Bernard:[id number] DOB: 06-Sep-1950 DOA: 06/19/2020  PCP: Estanislado Pandy, MD  Patient coming from: Home  I have personally briefly reviewed patient's old medical records in [facility]  Chief Complaint: Shortness of Breath  HPI: Mathew Bernard is a 70 y.o. male with medical history significant of hypothyroidism, CVA (2014), OSA, and depression who presents with increasing shortness of breath in the setting of known COVID-19 infection. He has had intermittent dyspnea, fevers, and a minimally productive cough for two weeks. Early on in his illness he tested positive for COVID at home. However, his shortness of breath and chest tightness became acutely worse overnight and he elected to come to the ED. He also endorses 4-5 days of non-bloody diarrhea. Denies n/v. No CP. He is unvaccinated against COVID and this is his first time having the virus.  He says that he is otherwise "pretty healthy" and is able to independently complete ADLs/IADLs. He has no other complaints at this time.       ED Course: Patient arrived with sats in the 80s on RA, requiring 2L [place] to maintain sats in 90s. CXR with diffuse mild bilateral interstitial prominence. CTA without evidence of PE but with widely scattered patchy opacities consistent with COVID-19. Also with reactive appearing mediastinal and hilar lymph nodes. Respiratory panel positive for COVID-19. Given one dose of decadron. Patient admitted to hospitalist service for further management.   Review of Systems: As per HPI otherwise 10 point review of systems negative.    Past Medical History:  Diagnosis Date  . Anxiety   . Arthritis   . Complication of anesthesia     "depressed after surgery"  . Depression   . Diabetes mellitus without complication (HCC)   . GERD (gastroesophageal reflux disease)   . High cholesterol   . History of kidney stones   . Hypertension   . Hypothyroidism   . Pneumonia    hx of in 70's   . Sleep apnea    CPAP  . Stroke (HCC) 11/14/2012    right basal ganglia infarct  . Thyroid disease     Past Surgical History:  Procedure Laterality Date  . BACK SURGERY  2013   cervical  . KIDNEY STONE SURGERY    . LUMBAR LAMINECTOMY/DECOMPRESSION MICRODISCECTOMY N/A 10/07/2012   Procedure: MICRODISCECTOMY L4-5;  Surgeon: Javier Docker, MD;  Location: [facility];  Service: Orthopedics;  Laterality: N/A;  . nerve surgery     helped with headaches    Social History:  reports that he quit smoking about 7 years ago. His smoking use included cigarettes. He quit after 40.00 years of use. He has never used smokeless tobacco. He reports that he does not drink alcohol and does not use drugs.  Allergies  Allergen Reactions  . Alteplase     Lip and tongue swelling  . Benadryl [Diphenhydramine Hcl] Other (See Comments)    Restlessness, anxiety  . Codeine     Jittery     No family history on file. Family history: Family history reviewed and not pertinent  Prior to Admission medications   Medication Sig Start Date End Date Taking? Authorizing Provider  albuterol (PROVENTIL HFA;VENTOLIN HFA) 108 (90 BASE) MCG/ACT inhaler Inhale 2 puffs into the lungs every 6 (six) hours as needed for wheezing.    [provider]  amLODipine-benazepril (LOTREL) 5-10 MG per capsule Take 1 capsule by mouth daily.    [provider]  aspirin  EC 81 MG tablet Take 1 tablet (81 mg total) by mouth daily. 01/17/13   Ronal Fear, NP  cetirizine (ZYRTEC) 10 MG tablet Take 10 mg by mouth daily as needed for allergies.     [provider]  co-enzyme Q-10 50 MG capsule Take 50 mg by mouth daily.    [provider]  fluticasone (FLONASE) 50 MCG/ACT nasal spray Place 2 sprays into the nose as needed for rhinitis.    [provider]  levothyroxine (SYNTHROID, LEVOTHROID) 50 MCG tablet Take 50 mcg by mouth daily before breakfast.    [provider]  Omega-3 Fatty Acids  (FISH OIL) 1200 MG CAPS Take 2,400 mg by mouth 2 (two) times daily.    [provider]  omeprazole (PRILOSEC) 20 MG capsule Take 20 mg by mouth 2 (two) times daily.    [provider]  pravastatin (PRAVACHOL) 40 MG tablet Take 1 tablet (40 mg total) by mouth daily. 01/17/13   Ronal Fear, NP  sertraline (ZOLOFT) 100 MG tablet Take 150 mg by mouth at bedtime.    [provider]  vitamin B-12 (CYANOCOBALAMIN) 1000 MCG tablet Take 1,000 mcg by mouth daily.    [provider]    Physical Exam: Vitals:   06/19/20 0845 06/19/20 0900 06/19/20 0915 06/19/20 0930  BP:  (!) 139/104    Pulse: 70 86 78   Resp: (!) 22 (!) 28 (!) 24 (!) 21  Temp:      SpO2: 93% 99% 95%   Weight:      Height:        Physical Exam Vitals reviewed.  Constitutional:      General: He is not in acute distress.    Appearance: He is not toxic-appearing.  HENT:     Mouth/Throat:     Mouth: Mucous membranes are moist.  Eyes:     General: No scleral icterus.    Extraocular Movements: Extraocular movements intact.  Cardiovascular:     Rate and Rhythm: Normal rate and regular rhythm.     Pulses: Normal pulses.     Heart sounds: Normal heart sounds.  Pulmonary:     Comments: Normal WOB on 2L but with intermittent violent, non-productive cough. Diffuse crackles in bilateral lung fields. Chest:     Chest wall: No tenderness.  Musculoskeletal:        General: No swelling or deformity.  Skin:    General: Skin is warm and dry.     Findings: No lesion or rash.  Neurological:     General: No focal deficit present.     Mental Status: He is alert.  Psychiatric:        Mood and Affect: Mood normal.        Behavior: Behavior normal.       Labs on Admission: I have personally reviewed following labs and imaging studies  CBC: Recent Labs  Lab 06/19/20 0607  WBC 4.1  NEUTROABS 3.3  HGB 13.6  HCT 41.9  MCV 86.2  PLT 120*   Basic Metabolic Panel: Recent Labs  Lab  06/19/20 0607  NA 137  K 3.5  CL 103  CO2 24  GLUCOSE 176*  BUN 24*  CREATININE 1.03  CALCIUM 8.5*   GFR: Estimated Creatinine Clearance: 76.3 mL/min (by C-G formula based on SCr of 1.03 mg/dL). Liver Function Tests: Recent Labs  Lab 06/19/20 0607  AST 36  ALT 35  ALKPHOS 75  BILITOT 0.8  PROT 6.7  ALBUMIN  3.4*   No results for input(s): LIPASE, AMYLASE in the last 168 hours. No results for input(s): AMMONIA in the last 168 hours. Coagulation Profile: No results for input(s): INR, PROTIME in the last 168 hours. Cardiac Enzymes: No results for input(s): CKTOTAL, CKMB, CKMBINDEX, TROPONINI in the last 168 hours. BNP (last 3 results) No results for input(s): PROBNP in the last 8760 hours. HbA1C: No results for input(s): HGBA1C in the last 72 hours. CBG: No results for input(s): GLUCAP in the last 168 hours. Lipid Profile: Recent Labs    06/19/20 0607  TRIG 121   Thyroid Function Tests: No results for input(s): TSH, T4TOTAL, FREET4, T3FREE, THYROIDAB in the last 72 hours. Anemia Panel: Recent Labs    06/19/20 0607  FERRITIN 467*   Urine analysis:    Component Value Date/Time   COLORURINE YELLOW 11/14/2012 1508   APPEARANCEUR CLEAR 11/14/2012 1508   LABSPEC 1.013 11/14/2012 1508   PHURINE 6.0 11/14/2012 1508   GLUCOSEU NEGATIVE 11/14/2012 1508   HGBUR NEGATIVE 11/14/2012 1508   BILIRUBINUR NEGATIVE 11/14/2012 1508   KETONESUR NEGATIVE 11/14/2012 1508   PROTEINUR NEGATIVE 11/14/2012 1508   UROBILINOGEN 0.2 11/14/2012 1508   NITRITE NEGATIVE 11/14/2012 1508   LEUKOCYTESUR NEGATIVE 11/14/2012 1508    Radiological Exams on Admission: CT Angio Chest PE W/Cm &/Or Wo Cm  Result Date: 06/19/2020 CLINICAL DATA:  70 year old male diagnosed with COVID-19 2 weeks ago. Increased cough and shortness of breath. EXAM: CT ANGIOGRAPHY CHEST WITH CONTRAST TECHNIQUE: Multidetector CT imaging of the chest was performed using the standard protocol during bolus  administration of intravenous contrast. Multiplanar CT image reconstructions and MIPs were obtained to evaluate the vascular anatomy. CONTRAST:  OMNIPAQUE IOHEXOL 350 MG/ML SOLN COMPARISON:  Portable chest x-ray this morning. Report of chest CT without contrast 05/03/2012 (no images available). FINDINGS: Cardiovascular: Adequate contrast bolus timing in the pulmonary arterial tree. Mild respiratory motion. No focal filling defect identified in the pulmonary arteries to suggest acute pulmonary embolism. Extensive calcified coronary artery atherosclerosis (series 6, image 132). No cardiomegaly or pericardial effusion. Negative visible aorta aside from atherosclerosis. Mediastinum/Nodes: Reactive appearing mediastinal lymph nodes, up to 13 mm in the anterior carina nodal station. Subcentimeter nodes otherwise. Similar mild reactive appearing hilar lymph nodes. Lungs/Pleura: Major airways are patent.  No pleural effusion. Patchy, widely scattered bilateral peribronchial ground-glass and other sub solid pulmonary opacity. Bilateral middle lobes relatively spared. Upper lobes and superior segment lower lobes most affected. Superimposed mild dependent atelectasis. No areas of consolidation at this time. Upper Abdomen: Negative visible liver (punctate left hepatic lobe calcified granuloma), spleen, pancreas, adrenal glands and bowel in the upper abdomen. Partially visible right upper pole renal cyst with simple fluid density. Musculoskeletal: Lower cervical ACDF. Mildly exaggerated thoracic kyphosis. Upper thoracic posterior element degeneration including bulky calcification of ligament flavum. No acute osseous abnormality identified. Review of the MIP images confirms the above findings. IMPRESSION: 1. Negative for acute pulmonary embolus. 2. Widely scattered patchy pulmonary opacity typical for COVID-19 pneumonia. Reactive appearing mediastinal and hilar lymph nodes. 3. Calcified coronary artery and Aortic  Atherosclerosis (ICD10-I70.0). Electronically Signed   By: Odessa Fleming M.D.   On: 06/19/2020 09:21   DG Chest Port 1 View  Result Date: 06/19/2020 CLINICAL DATA:  Cough.  History of COVID. EXAM: PORTABLE CHEST 1 VIEW COMPARISON:  Chest x-ray 11/14/2012. FINDINGS: Heart size normal. Lung volumes. Diffuse mild bilateral interstitial prominence consistent with pneumonitis. No pleural effusion or pneumothorax. Cervical spine fusion. Degenerative change thoracic spine.  IMPRESSION: Diffuse mild bilateral interstitial prominence consistent with pneumonitis. Electronically Signed   By: Maisie Fushomas  Register   On: 06/19/2020 06:38    EKG: Independently reviewed. Normal sinus rhythm.   Assessment/Plan Active Problems:   OSA (obstructive sleep apnea)   Essential hypertension, benign   Acute hypoxemic respiratory failure due to COVID-19 (HCC)   Depression   Diabetes (HCC)   GERD (gastroesophageal reflux disease)   Hypothyroidism    Acute hypoxemic respiratory failure due to COVID-19 Patient is two weeks into illness with persistent cough and worsening SOB. Now requiring 2L supplemental O2. Ferritin 467. D-dimer 2.06. CRP 3.7. No lactic acidosis or white count. Mild thrombocytopenia (plt 120) likely 2/2 virus.  - Decadron 6mg  daily  - Supplemental O2 as needed - Wean O2 as tolerated  - Prone as tolerated   Diabetes Class I Obesity (BMI 32) Increases risk of severe disease with COVID-19. On metformin at home. Anticipate hyperglycemia with steroids.  - Hold metformin - Start tradjenta given mortality benefit in type 2 diabetics with COVID - CBGs TID and qhs - SSI  - Carb-modified diet  Hypothyroidism Chronic. Stable.  - Continue home synthroid   History of stroke (2014) - Continue home statin, aspirin  Depression Chronic, stable. - Continue home Zoloft   GERD Chronic, stable. - Continue home omeprazole  Esential Hypertension BPs 130s/100s in ED. - Continue home amlodipine  OSA - CPAP  nightly   DVT prophylaxis: Lovenox Code Status: DNI  Family Communication: Wife visiting in hospital  Disposition Plan: Home  Consults called: None  Admission status: Inpatient    Dorothyann GibbsBailey Shreyansh Tiffany Medical Student  [facility]   If 7PM-7AM, please contact night-coverage [email or web address]   06/19/2020, 11:11 AM

## 2020-06-19 NOTE — ED Provider Notes (Signed)
[facility] EMERGENCY DEPARTMENT Provider Note   CSN: [id number] Arrival date & time: 06/19/20  0630     History Chief Complaint  Patient presents with  . Shortness of Breath    Mathew Bernard is a 70 y.o. male.  HPI     This is a 70 year old male with a history of diabetes, hypertension, sleep apnea, stroke who presents with ongoing shortness of breath, cough, and diarrhea.  Patient reports that he tested positive at home for COVID-19 approximately 2 weeks ago.  He reports having persistent every day low-grade fevers.  He has a nonproductive cough.  He reports some shortness of breath with some chest tightness.  He also reports nonbloody diarrhea.  No vomiting.  No abdominal pain, nausea.  He is not vaccinated against COVID-19.  Past Medical History:  Diagnosis Date  . Anxiety   . Arthritis   . Complication of anesthesia     "depressed after surgery"  . Depression   . Diabetes mellitus without complication (HCC)   . GERD (gastroesophageal reflux disease)   . High cholesterol   . History of kidney stones   . Hypertension   . Hypothyroidism   . Pneumonia    hx of in 70's  . Sleep apnea    CPAP  . Stroke (HCC) 11/14/2012    right basal ganglia infarct  . Thyroid disease     Patient Active Problem List   Diagnosis Date Noted  . OSA (obstructive sleep apnea) 11/16/2012  . Essential hypertension, benign 11/16/2012  . Other and unspecified hyperlipidemia 11/16/2012  . Acute ischemic left MCA stroke (HCC) 11/16/2012  . HNP (herniated nucleus pulposus), lumbar 10/07/2012    Past Surgical History:  Procedure Laterality Date  . BACK SURGERY  2013   cervical  . KIDNEY STONE SURGERY    . LUMBAR LAMINECTOMY/DECOMPRESSION MICRODISCECTOMY N/A 10/07/2012   Procedure: MICRODISCECTOMY L4-5;  Surgeon: Javier Docker, MD;  Location: [facility];  Service: Orthopedics;  Laterality: N/A;  . nerve surgery     helped with headaches       No family history on file.  Social  History   Tobacco Use  . Smoking status: Former Smoker    Years: 40.00    Types: Cigarettes    Quit date: 11/14/2012    Years since quitting: 7.6  . Smokeless tobacco: Never Used  Substance Use Topics  . Alcohol use: No  . Drug use: No    Home Medications Prior to Admission medications   Medication Sig Start Date End Date Taking? Authorizing Provider  albuterol (PROVENTIL HFA;VENTOLIN HFA) 108 (90 BASE) MCG/ACT inhaler Inhale 2 puffs into the lungs every 6 (six) hours as needed for wheezing.    [provider]  amLODipine-benazepril (LOTREL) 5-10 MG per capsule Take 1 capsule by mouth daily.    [provider]  aspirin EC 81 MG tablet Take 1 tablet (81 mg total) by mouth daily. 01/17/13   Ronal Fear, NP  cetirizine (ZYRTEC) 10 MG tablet Take 10 mg by mouth daily as needed for allergies.     [provider]  co-enzyme Q-10 50 MG capsule Take 50 mg by mouth daily.    [provider]  fluticasone (FLONASE) 50 MCG/ACT nasal spray Place 2 sprays into the nose as needed for rhinitis.    [provider]  levothyroxine (SYNTHROID, LEVOTHROID) 50 MCG tablet Take 50 mcg by mouth daily before breakfast.    [provider]  Omega-3 Fatty Acids (FISH  OIL) 1200 MG CAPS Take 2,400 mg by mouth 2 (two) times daily.    [provider]  omeprazole (PRILOSEC) 20 MG capsule Take 20 mg by mouth 2 (two) times daily.    [provider]  pravastatin (PRAVACHOL) 40 MG tablet Take 1 tablet (40 mg total) by mouth daily. 01/17/13   Ronal Fear, NP  sertraline (ZOLOFT) 100 MG tablet Take 150 mg by mouth at bedtime.    [provider]  vitamin B-12 (CYANOCOBALAMIN) 1000 MCG tablet Take 1,000 mcg by mouth daily.    [provider]    Allergies    Alteplase, Benadryl [diphenhydramine hcl], and Codeine  Review of Systems   Review of Systems  Constitutional: Positive for fatigue and fever.  Respiratory: Positive for cough,  chest tightness and shortness of breath.   Cardiovascular: Negative for chest pain and leg swelling.  Gastrointestinal: Positive for diarrhea. Negative for abdominal pain, nausea and vomiting.  Genitourinary: Negative for dysuria.  All other systems reviewed and are negative.   Physical Exam Updated Vital Signs BP (!) 156/77   Pulse 74   Temp 98.7 F (37.1 C)   Resp (!) 22   Ht 1.74 m (5' 8.5")   Wt 97.5 kg   SpO2 92%   BMI 32.22 kg/m   Physical Exam Vitals and nursing note reviewed.  Constitutional:      Appearance: He is well-developed and well-nourished. He is obese.  HENT:     Head: Normocephalic and atraumatic.  Eyes:     Pupils: Pupils are equal, round, and reactive to light.  Cardiovascular:     Rate and Rhythm: Normal rate and regular rhythm.     Heart sounds: Normal heart sounds. No murmur heard.   Pulmonary:     Effort: Pulmonary effort is normal. No respiratory distress.     Breath sounds: Rhonchi present. No wheezing.  Abdominal:     General: Bowel sounds are normal.     Palpations: Abdomen is soft.     Tenderness: There is no abdominal tenderness. There is no rebound.  Musculoskeletal:     Cervical back: Neck supple.     Right lower leg: No edema.     Left lower leg: No edema.  Lymphadenopathy:     Cervical: No cervical adenopathy.  Skin:    General: Skin is warm and dry.  Neurological:     Mental Status: He is alert and oriented to person, place, and time.  Psychiatric:        Mood and Affect: Mood and affect and mood normal.     ED Results / Procedures / Treatments   Labs (all labs ordered are listed, but only abnormal results are displayed) Labs Reviewed  CULTURE, BLOOD (ROUTINE X 2)  CULTURE, BLOOD (ROUTINE X 2)  RESP PANEL BY RT-PCR (FLU A&B, COVID) ARPGX2  LACTIC ACID, PLASMA  LACTIC ACID, PLASMA  CBC WITH DIFFERENTIAL/PLATELET  COMPREHENSIVE METABOLIC PANEL  D-DIMER, QUANTITATIVE  PROCALCITONIN  LACTATE DEHYDROGENASE  FERRITIN   TRIGLYCERIDES  FIBRINOGEN  C-REACTIVE PROTEIN    EKG EKG Interpretation  Date/Time:  Wednesday June 19 2020 06:36:33 EST Ventricular Rate:  72 PR Interval:    QRS Duration: 89 QT Interval:  383 QTC Calculation: 420 R Axis:   4 Text Interpretation: Sinus rhythm Confirmed by Ross Marcus ([id number]) on 06/19/2020 6:40:55 AM   Radiology DG Chest Port 1 View  Result Date: 06/19/2020 CLINICAL DATA:  Cough.  History of COVID. EXAM: PORTABLE CHEST 1 VIEW  COMPARISON:  Chest x-ray 11/14/2012. FINDINGS: Heart size normal. Lung volumes. Diffuse mild bilateral interstitial prominence consistent with pneumonitis. No pleural effusion or pneumothorax. Cervical spine fusion. Degenerative change thoracic spine. IMPRESSION: Diffuse mild bilateral interstitial prominence consistent with pneumonitis. Electronically Signed   By: Maisie Fus  Register   On: 06/19/2020 06:38    Procedures Procedures   Medications Ordered in ED Medications - No data to display  ED Course  I have reviewed the triage vital signs and the nursing notes.  Pertinent labs & imaging results that were available during my care of the patient were reviewed by me and considered in my medical decision making (see chart for details).    MDM Rules/Calculators/A&P                          Patient presents with ongoing cough, fever, generalized weakness. He is nontoxic-appearing. No respiratory distress. O2 sats 92%. He reports a positive home Covid test within the last 2 weeks. No confirmatory testing or outpatient treatment. He is not vaccinated. He has some coarse breath sounds but is otherwise nontoxic. Considerations include but not limited to, COVID-19, pneumonia, influenza. EKG without acute ischemic or arrhythmic changes. Chest x-ray shows mild bilateral interstitial prominence consistent with pneumonitis. Feel this is also likely consistent with COVID-19. Lab work-up is pending. He will need reassessment and ambulation. If O2  sats fall, may need admission. He is out of the window for remdesivir.  Final Clinical Impression(s) / ED Diagnoses Final diagnoses:  None    Rx / DC Orders ED Discharge Orders    None       Hila Bolding, Mayer Masker, MD 06/19/20 [phone]

## 2020-06-20 LAB — COMPREHENSIVE METABOLIC PANEL
ALT: 35 U/L (ref 0–44)
AST: 42 U/L — ABNORMAL HIGH (ref 15–41)
Albumin: 3.3 g/dL — ABNORMAL LOW (ref 3.5–5.0)
Alkaline Phosphatase: 70 U/L (ref 38–126)
Anion gap: 9 (ref 5–15)
BUN: 26 mg/dL — ABNORMAL HIGH (ref 8–23)
CO2: 24 mmol/L (ref 22–32)
Calcium: 8.8 mg/dL — ABNORMAL LOW (ref 8.9–10.3)
Chloride: 104 mmol/L (ref 98–111)
Creatinine, Ser: 0.95 mg/dL (ref 0.61–1.24)
GFR, Estimated: >60 mL/min (ref >60)
Glucose, Bld: 190 mg/dL — ABNORMAL HIGH (ref 70–99)
Potassium: 4 mmol/L (ref 3.5–5.1)
Sodium: 137 mmol/L (ref 135–145)
Total Bilirubin: 0.7 mg/dL (ref 0.3–1.2)
Total Protein: 6.7 g/dL (ref 6.5–8.1)

## 2020-06-20 LAB — CBC
HCT: 42.9 % (ref 39.0–52.0)
Hemoglobin: 13.6 g/dL (ref 13.0–17.0)
MCH: 27.3 pg (ref 26.0–34.0)
MCHC: 31.7 g/dL (ref 30.0–36.0)
MCV: 86.1 fL (ref 80.0–100.0)
Platelets: 120 10*3/uL — ABNORMAL LOW (ref 150–400)
RBC: 4.98 MIL/uL (ref 4.22–5.81)
RDW: 14.1 % (ref 11.5–15.5)
WBC: 3.7 10*3/uL — ABNORMAL LOW (ref 4.0–10.5)
nRBC: 0 % (ref 0.0–0.2)

## 2020-06-20 LAB — CBG MONITORING, ED
Glucose-Capillary: 214 mg/dL — ABNORMAL HIGH (ref 70–99)
Glucose-Capillary: 101 mg/dL — ABNORMAL HIGH (ref 70–99)

## 2020-06-20 LAB — GLUCOSE, CAPILLARY
Glucose-Capillary: 201 mg/dL — ABNORMAL HIGH (ref 70–99)
Glucose-Capillary: 152 mg/dL — ABNORMAL HIGH (ref 70–99)

## 2020-06-20 LAB — D-DIMER, QUANTITATIVE: D-Dimer, Quant: 2.07 ug/mL-FEU — ABNORMAL HIGH (ref 0.00–0.50)

## 2020-06-20 LAB — C-REACTIVE PROTEIN: CRP: 4.8 mg/dL — ABNORMAL HIGH (ref <1.0)

## 2020-06-20 LAB — FERRITIN: Ferritin: 545 ng/mL — ABNORMAL HIGH (ref 24–336)

## 2020-06-20 MED ORDER — DM-GUAIFENESIN ER 30-600 MG PO TB12
1.0000 | ORAL_TABLET | Freq: Two times a day (BID) | ORAL | Status: DC
  Administered 2020-06-20 – 2020-06-21 (×2): 1 via ORAL
  Filled 2020-06-20 (×2): qty 1

## 2020-06-20 NOTE — Progress Notes (Signed)
PROGRESS NOTE    Mathew Bernard  TSV:[id number] DOB: 03-Dec-1950 DOA: 06/19/2020 PCP: Estanislado Pandy, MD    Brief Narrative:  Mathew Bernard is a 70yo male with a history of CVA (2014), hypothyroidism, OSA, DM2 and depression who is admitted with COVID-19. He is currently requiring 2L [place] and is receiving IV solu-medrol. Inflammatory markers trending up slightly 2/24.    Assessment & Plan:   Active Problems:   OSA (obstructive sleep apnea)   Essential hypertension, benign   Acute hypoxemic respiratory failure due to COVID-19 (HCC)   Depression   Diabetes (HCC)   GERD (gastroesophageal reflux disease)   Hypothyroidism   Acute hypoxemic respiratory failure due to COVID-19 Patient is two weeks into illness with persistent cough and worsening SOB. Now requiring 2L supplemental O2. Ferritin [id number]. D-dimer 2.06>2.07. CRP 3.7>4.8. No white count. Mild thrombocytopenia (plt 120) likely 2/2 virus.  - Solu-Medrol 0.5mg /kg q12   - Supplemental O2 as needed - Robotussin PRN for cough - Wean O2 as tolerated  - Prone as tolerated   Diabetes Class I Obesity (BMI 32) Increases risk of severe disease with COVID-19. Glucoses 145-213. Received 25u SAI over past 24 hours. On metformin at home. Anticipate hyperglycemia with steroids.  - Hold metformin - Start tradjenta given mortality benefit in type 2 diabetics with COVID - CBGs TID and qhs - SSI  - Carb-modified diet  Hypothyroidism Chronic. Stable.  - Continue home synthroid   History of stroke (2014) - Continue home statin, aspirin  Depression Chronic, stable. - Continue home Zoloft   GERD Chronic, stable. - Continue home omeprazole  Esential Hypertension - Continue home amlodipine/benazepril   OSA - CPAP nightly   DVT prophylaxis: enoxaparin (LOVENOX) injection 40 mg Start: 06/19/20 1600  Code Status: DNI Family Communication: None at bedside Disposition Plan: Status is: Inpatient  Remains inpatient appropriate  because:Inpatient level of care appropriate due to severity of illness   Dispo: The patient is from: Home              Anticipated d/c is to: Home              Anticipated d/c date is: 1 day              Patient currently is not medically stable to d/c.   Difficult to place patient No   Consultants:   None  Procedures:   None  Antimicrobials:   None    Subjective: Mathew Bernard says that he's "alright" this morning. His main complaint is the persistent violent cough that has prevented him from resting while in the hospital. He denies any further diarrhea.   Objective: Vitals:   06/20/20 0630 06/20/20 0645 06/20/20 0700 06/20/20 0731  BP: 137/67  (!) 124/59   Pulse: (!) 48 85 (!) 59 (!) 56  Resp:  10 (!) 22 20  Temp:      SpO2: 90% (!) 88% (!) 89% 90%  Weight:      Height:       No intake or output data in the 24 hours ending 06/20/20 1008 Filed Weights   06/19/20 0545  Weight: 97.5 kg    Examination:  Physical Exam Constitutional:      General: He is not in acute distress. Cardiovascular:     Rate and Rhythm: Regular rhythm. Bradycardia present.     Heart sounds: Normal heart sounds.  Pulmonary:     Effort: No accessory muscle usage or respiratory distress.  Breath sounds: Rales (diffuse, worse on R) present.     Comments: Breathing comfortably on 2L [place] Musculoskeletal:        General: No swelling, tenderness or deformity.  Skin:    General: Skin is warm and dry.  Neurological:     Mental Status: He is alert.        Data Reviewed: I have personally reviewed following labs and imaging studies  CBC: Recent Labs  Lab 06/19/20 0607 06/20/20 0604  WBC 4.1 3.7*  NEUTROABS 3.3  --   HGB 13.6 13.6  HCT 41.9 42.9  MCV 86.2 86.1  PLT 120* 120*   Basic Metabolic Panel: Recent Labs  Lab 06/19/20 0607 06/19/20 1217 06/20/20 0604  NA 137  --  137  K 3.5  --  4.0  CL 103  --  104  CO2 24  --  24  GLUCOSE 176*  --  190*  BUN 24*  --  26*   CREATININE 1.03 0.98 0.95  CALCIUM 8.5*  --  8.8*   GFR: Estimated Creatinine Clearance: 82.7 mL/min (by C-G formula based on SCr of 0.95 mg/dL). Liver Function Tests: Recent Labs  Lab 06/19/20 0607 06/20/20 0604  AST 36 42*  ALT 35 35  ALKPHOS 75 70  BILITOT 0.8 0.7  PROT 6.7 6.7  ALBUMIN 3.4* 3.3*   No results for input(s): LIPASE, AMYLASE in the last 168 hours. No results for input(s): AMMONIA in the last 168 hours. Coagulation Profile: No results for input(s): INR, PROTIME in the last 168 hours. Cardiac Enzymes: No results for input(s): CKTOTAL, CKMB, CKMBINDEX, TROPONINI in the last 168 hours. BNP (last 3 results) No results for input(s): PROBNP in the last 8760 hours. HbA1C: No results for input(s): HGBA1C in the last 72 hours. CBG: Recent Labs  Lab 06/19/20 1236 06/19/20 1642 06/19/20 2156 06/20/20 0828  GLUCAP 159* 175* 145* 214*   Lipid Profile: Recent Labs    06/19/20 0607  TRIG 121   Thyroid Function Tests: No results for input(s): TSH, T4TOTAL, FREET4, T3FREE, THYROIDAB in the last 72 hours. Anemia Panel: Recent Labs    06/19/20 0607 06/20/20 0604  FERRITIN 467* 545*   Sepsis Labs: Recent Labs  Lab 06/19/20 0607 06/19/20 0814  PROCALCITON <0.10  --   LATICACIDVEN 1.4 1.1    Recent Results (from the past 240 hour(s))  Resp Panel by RT-PCR (Flu A&B, Covid) Nasopharyngeal Swab     Status: Abnormal   Collection Time: 06/19/20  6:07 AM   Specimen: Nasopharyngeal Swab; Nasopharyngeal(NP) swabs in vial transport medium  Result Value Ref Range Status   SARS Coronavirus 2 by RT PCR POSITIVE (A) NEGATIVE Final    Comment: RESULT CALLED TO, READ BACK BY AND VERIFIED WITH: MICHAEL DOSS 06/19/20 @ 0830 BY S. BEARD (NOTE) SARS-CoV-2 target nucleic acids are DETECTED.  The SARS-CoV-2 RNA is generally detectable in upper respiratory specimens during the acute phase of infection. Positive results are indicative of the presence of the identified  virus, but do not rule out bacterial infection or co-infection with other pathogens not detected by the test. Clinical correlation with patient history and other diagnostic information is necessary to determine patient infection status. The expected result is Negative.  Fact Sheet for Patients: BloggerCourse.comhttps://www.fda.gov/media/152166/download  Fact Sheet for Healthcare Providers: SeriousBroker.ithttps://www.fda.gov/media/152162/download  This test is not yet approved or cleared by the [place] FDA and  has been authorized for detection and/or diagnosis of SARS-CoV-2 by FDA under an Emergency Use Authorization (EUA).  This EUA will remain in effect (meaning this test  can be used) for the duration of  the COVID-19 declaration under Section 564(b)(1) of the Act, 21 U.S.C. section 360bbb-3(b)(1), unless the authorization is terminated or revoked sooner.     Influenza A by PCR NEGATIVE NEGATIVE Final   Influenza B by PCR NEGATIVE NEGATIVE Final    Comment: (NOTE) The Xpert Xpress SARS-CoV-2/FLU/RSV plus assay is intended as an aid in the diagnosis of influenza from Nasopharyngeal swab specimens and should not be used as a sole basis for treatment. Nasal washings and aspirates are unacceptable for Xpert Xpress SARS-CoV-2/FLU/RSV testing.  Fact Sheet for Patients: BloggerCourse.com  Fact Sheet for Healthcare Providers: SeriousBroker.it  This test is not yet approved or cleared by the [place] FDA and has been authorized for detection and/or diagnosis of SARS-CoV-2 by FDA under an Emergency Use Authorization (EUA). This EUA will remain in effect (meaning this test can be used) for the duration of the COVID-19 declaration under Section 564(b)(1) of the Act, 21 U.S.C. section 360bbb-3(b)(1), unless the authorization is terminated or revoked.  Performed at [facility], [address] [place], [place] [address]          Radiology  Studies: CT Angio Chest PE W/Cm &/Or Wo Cm  Result Date: 06/19/2020 CLINICAL DATA:  70 year old male diagnosed with COVID-19 2 weeks ago. Increased cough and shortness of breath. EXAM: CT ANGIOGRAPHY CHEST WITH CONTRAST TECHNIQUE: Multidetector CT imaging of the chest was performed using the standard protocol during bolus administration of intravenous contrast. Multiplanar CT image reconstructions and MIPs were obtained to evaluate the vascular anatomy. CONTRAST:  OMNIPAQUE IOHEXOL 350 MG/ML SOLN COMPARISON:  Portable chest x-ray this morning. Report of chest CT without contrast 05/03/2012 (no images available). FINDINGS: Cardiovascular: Adequate contrast bolus timing in the pulmonary arterial tree. Mild respiratory motion. No focal filling defect identified in the pulmonary arteries to suggest acute pulmonary embolism. Extensive calcified coronary artery atherosclerosis (series 6, image 132). No cardiomegaly or pericardial effusion. Negative visible aorta aside from atherosclerosis. Mediastinum/Nodes: Reactive appearing mediastinal lymph nodes, up to 13 mm in the anterior carina nodal station. Subcentimeter nodes otherwise. Similar mild reactive appearing hilar lymph nodes. Lungs/Pleura: Major airways are patent.  No pleural effusion. Patchy, widely scattered bilateral peribronchial ground-glass and other sub solid pulmonary opacity. Bilateral middle lobes relatively spared. Upper lobes and superior segment lower lobes most affected. Superimposed mild dependent atelectasis. No areas of consolidation at this time. Upper Abdomen: Negative visible liver (punctate left hepatic lobe calcified granuloma), spleen, pancreas, adrenal glands and bowel in the upper abdomen. Partially visible right upper pole renal cyst with simple fluid density. Musculoskeletal: Lower cervical ACDF. Mildly exaggerated thoracic kyphosis. Upper thoracic posterior element degeneration including bulky calcification of ligament flavum. No  acute osseous abnormality identified. Review of the MIP images confirms the above findings. IMPRESSION: 1. Negative for acute pulmonary embolus. 2. Widely scattered patchy pulmonary opacity typical for COVID-19 pneumonia. Reactive appearing mediastinal and hilar lymph nodes. 3. Calcified coronary artery and Aortic Atherosclerosis (ICD10-I70.0). Electronically Signed   By: Odessa Fleming M.D.   On: 06/19/2020 09:21   DG Chest Port 1 View  Result Date: 06/19/2020 CLINICAL DATA:  Cough.  History of COVID. EXAM: PORTABLE CHEST 1 VIEW COMPARISON:  Chest x-ray 11/14/2012. FINDINGS: Heart size normal. Lung volumes. Diffuse mild bilateral interstitial prominence consistent with pneumonitis. No pleural effusion or pneumothorax. Cervical spine fusion. Degenerative change thoracic spine. IMPRESSION: Diffuse mild bilateral interstitial prominence consistent with pneumonitis. Electronically  Signed   By: Maisie Fus  Register   On: 06/19/2020 06:38        Scheduled Meds: . amLODipine  5 mg Oral Daily   And  . benazepril  10 mg Oral Daily  . aspirin EC  81 mg Oral Daily  . enoxaparin (LOVENOX) injection  40 mg Subcutaneous Q24H  . insulin aspart  0-15 Units Subcutaneous TID WC  . insulin aspart  0-5 Units Subcutaneous QHS  . insulin aspart  4 Units Subcutaneous TID WC  . levothyroxine  50 mcg Oral QAC breakfast  . linagliptin  5 mg Oral Daily  . loratadine  10 mg Oral Daily  . methylPREDNISolone (SOLU-MEDROL) injection  0.5 mg/kg (Adjusted) Intravenous Q12H  . pantoprazole  40 mg Oral Daily  . pravastatin  40 mg Oral q1800  . sertraline  150 mg Oral QHS  . vitamin B-12  1,000 mcg Oral Daily   Continuous Infusions:   LOS: 1 day    Time spent: 30 minutes    Dorothyann Gibbs, Medical Student [facility]   If 7PM-7AM, please contact night-coverage [email or web address]  06/20/2020, 10:08 AM

## 2020-06-20 NOTE — ED Notes (Signed)
Pt given a cup of ice

## 2020-06-20 NOTE — Progress Notes (Signed)
Patient refused CPAP at this time.

## 2020-06-20 NOTE — ED Notes (Signed)
Patient assisted to bedside commode for BM. Placed back in bed. No other needs at this time.

## 2020-06-20 NOTE — Plan of Care (Signed)
  Problem: Education: Goal: Knowledge of General Education information will improve Description: Including pain rating scale, medication(s)/side effects and non-pharmacologic comfort measures Outcome: Progressing   Problem: Health Behavior/Discharge Planning: Goal: Ability to manage health-related needs will improve Outcome: Progressing   Problem: Education: Goal: Knowledge of risk factors and measures for prevention of condition will improve Outcome: Progressing   Problem: Coping: Goal: Psychosocial and spiritual needs will be supported Outcome: Progressing   

## 2020-06-20 NOTE — Progress Notes (Signed)
Patient refused CPAP. No unit in room at this time. 

## 2020-06-21 DIAGNOSIS — J9601 Acute respiratory failure with hypoxia: Secondary | ICD-10-CM

## 2020-06-21 LAB — CBC
HCT: 41.1 % (ref 39.0–52.0)
Hemoglobin: 13.2 g/dL (ref 13.0–17.0)
MCH: 27.7 pg (ref 26.0–34.0)
MCHC: 32.1 g/dL (ref 30.0–36.0)
MCV: 86.2 fL (ref 80.0–100.0)
Platelets: 139 10*3/uL — ABNORMAL LOW (ref 150–400)
RBC: 4.77 MIL/uL (ref 4.22–5.81)
RDW: 14 % (ref 11.5–15.5)
WBC: 5.5 10*3/uL (ref 4.0–10.5)
nRBC: 0 % (ref 0.0–0.2)

## 2020-06-21 LAB — GLUCOSE, CAPILLARY
Glucose-Capillary: 182 mg/dL — ABNORMAL HIGH (ref 70–99)
Glucose-Capillary: 131 mg/dL — ABNORMAL HIGH (ref 70–99)

## 2020-06-21 LAB — COMPREHENSIVE METABOLIC PANEL
ALT: 45 U/L — ABNORMAL HIGH (ref 0–44)
AST: 49 U/L — ABNORMAL HIGH (ref 15–41)
Albumin: 3.1 g/dL — ABNORMAL LOW (ref 3.5–5.0)
Alkaline Phosphatase: 63 U/L (ref 38–126)
Anion gap: 12 (ref 5–15)
BUN: 31 mg/dL — ABNORMAL HIGH (ref 8–23)
CO2: 24 mmol/L (ref 22–32)
Calcium: 8.9 mg/dL (ref 8.9–10.3)
Chloride: 104 mmol/L (ref 98–111)
Creatinine, Ser: 1.01 mg/dL (ref 0.61–1.24)
GFR, Estimated: >60 mL/min (ref >60)
Glucose, Bld: 200 mg/dL — ABNORMAL HIGH (ref 70–99)
Potassium: 4.2 mmol/L (ref 3.5–5.1)
Sodium: 140 mmol/L (ref 135–145)
Total Bilirubin: 0.8 mg/dL (ref 0.3–1.2)
Total Protein: 6.3 g/dL — ABNORMAL LOW (ref 6.5–8.1)

## 2020-06-21 LAB — FERRITIN: Ferritin: 489 ng/mL — ABNORMAL HIGH (ref 24–336)

## 2020-06-21 LAB — C-REACTIVE PROTEIN: CRP: 2.2 mg/dL — ABNORMAL HIGH (ref <1.0)

## 2020-06-21 LAB — D-DIMER, QUANTITATIVE: D-Dimer, Quant: 1.92 ug/mL-FEU — ABNORMAL HIGH (ref 0.00–0.50)

## 2020-06-21 MED ORDER — DM-GUAIFENESIN ER 30-600 MG PO TB12: 1.0000 | ORAL_TABLET | Freq: Two times a day (BID) | ORAL | 0 refills | Status: AC

## 2020-06-21 MED ORDER — PREDNISONE 10 MG PO TABS: 40.0000 mg | ORAL_TABLET | Freq: Every day | ORAL | 0 refills | Status: AC

## 2020-06-21 NOTE — Progress Notes (Addendum)
SATURATION QUALIFICATIONS: (This note is used to comply with regulatory documentation for home oxygen)  Patient Saturations on Room Air at Rest = 88%  Patient Saturations on Room Air while Ambulating =81%  Patient Saturations on 2 Liters of oxygen while Ambulating = 86%  Please briefly explain why patient needs home oxygen:

## 2020-06-21 NOTE — TOC Transition Note (Signed)
Transition of Care [facility]) - CM/SW Discharge Note   Patient Details  Name: Mathew Bernard MRN: [id number] Date of Birth: 07/13/1950  [facility] CM/SW Contact:  Leitha Bleak, RN Phone Number: 06/21/2020, 2:45 PM   Clinical Narrative:   Patient discharging home today requiring home oxygen. [facility] with [employer] accept the referral, is on site and will deliver to the room.   Final next level of care: Home/Self Care Barriers to Discharge: Barriers Resolved   Patient Goals and CMS Choice Patient states their goals for this hospitalization and ongoing recovery are:: to go home. CMS Medicare.gov Compare Post Acute Care list provided to:: Patient Choice offered to / list presented to : Patient  Discharge Placement           Patient and family notified of of transfer: 06/21/20  Discharge Plan and Services             DME Arranged: Oxygen DME Agency: [employer] Date DME Agency Contacted: 06/21/20 Time DME Agency Contacted: 1015 Representative spoke with at DME Agency: [facility]    Readmission Risk Interventions Readmission Risk Prevention Plan 06/21/2020  Post Dischage Appt Complete  Medication Screening Complete  Transportation Screening Complete  Some recent data might be hidden

## 2020-06-21 NOTE — Progress Notes (Signed)
Nsg Discharge Note  Admit Date:  06/19/2020 Discharge date: 06/21/2020   Mathew Bernard to be D/C'd Home per MD order.  AVS completed.  Copy for chart, and copy for patient signed, and dated. Removed IV-CDI. Reviewed d/c paperwork with patient. Answered all questions. Wheeled stable patient, belongings, and O2 to main entrance where he was picked up by his wife. Patient/caregiver able to verbalize understanding.  Discharge Medication: Allergies as of 06/21/2020      Reactions   Alteplase    Lip and tongue swelling   Benadryl [diphenhydramine Hcl] Other (See Comments)   Restlessness, anxiety   Codeine    Jittery       Medication List    STOP taking these medications   pravastatin 40 MG tablet Commonly known as: PRAVACHOL     TAKE these medications   albuterol 108 (90 Base) MCG/ACT inhaler Commonly known as: VENTOLIN HFA Inhale 2 puffs into the lungs every 6 (six) hours as needed for wheezing.   amLODipine-benazepril 5-10 MG capsule Commonly known as: LOTREL Take 1 capsule by mouth daily.   aspirin EC 81 MG tablet Take 1 tablet (81 mg total) by mouth daily.   cetirizine 10 MG tablet Commonly known as: ZYRTEC Take 10 mg by mouth daily as needed for allergies.   co-enzyme Q-10 50 MG capsule Take 50 mg by mouth daily.   dextromethorphan-guaiFENesin 30-600 MG 12hr tablet Commonly known as: MUCINEX DM Take 1 tablet by mouth 2 (two) times daily for 10 days.   Fish Oil 1200 MG Caps Take 2,400 mg by mouth 2 (two) times daily.   fluticasone 50 MCG/ACT nasal spray Commonly known as: FLONASE Place 2 sprays into the nose as needed for rhinitis.   levothyroxine 50 MCG tablet Commonly known as: SYNTHROID Take 50 mcg by mouth daily before breakfast.   metFORMIN 500 MG 24 hr tablet Commonly known as: GLUCOPHAGE-XR Take 500 mg by mouth 2 (two) times daily.   omeprazole 20 MG capsule Commonly known as: PRILOSEC Take 20 mg by mouth 2 (two) times daily.   predniSONE 10 MG  tablet Commonly known as: DELTASONE Take 4 tablets (40 mg total) by mouth daily for 7 days.   sertraline 100 MG tablet Commonly known as: ZOLOFT Take 150 mg by mouth at bedtime.   simvastatin 40 MG tablet Commonly known as: ZOCOR Take 40 mg by mouth at bedtime.   Stiolto Respimat 2.5-2.5 MCG/ACT Aers Generic drug: Tiotropium Bromide-Olodaterol Inhale 2 puffs into the lungs daily.   vitamin B-12 1000 MCG tablet Commonly known as: CYANOCOBALAMIN Take 1,000 mcg by mouth daily.            Durable Medical Equipment  (From admission, onward)         Start     Ordered   06/21/20 0936  For home use only DME oxygen  Once       Question Answer Comment  Length of Need 6 Months   Mode or (Route) Nasal cannula   Liters per Minute 2   Frequency Continuous (stationary and portable oxygen unit needed)   Oxygen conserving device Yes   Oxygen delivery system Gas      06/21/20 0935          Discharge Assessment: Vitals:   06/20/20 2221 06/21/20 0520  BP: 123/63 125/65  Pulse: (!) 57 (!) 52  Resp: 18 18  Temp: 98.1 F (36.7 C) 97.8 F (36.6 C)  SpO2: 94% 91%   Skin clean, dry and  intact without evidence of skin break down, no evidence of skin tears noted. IV catheter discontinued intact. Site without signs and symptoms of complications - no redness or edema noted at insertion site, patient denies c/o pain - only slight tenderness at site.  Dressing with slight pressure applied.  D/c Instructions-Education: Discharge instructions given to patient/family with verbalized understanding. D/c education completed with patient/family including follow up instructions, medication list, d/c activities limitations if indicated, with other d/c instructions as indicated by MD - patient able to verbalize understanding, all questions fully answered. Patient instructed to return to ED, call 911, or call MD for any changes in condition.  Patient escorted via WC, and D/C home via private  auto.  Karolee Ohs, RN 06/21/2020 2:47 PM

## 2020-06-21 NOTE — Plan of Care (Signed)
  Problem: Education: Goal: Knowledge of General Education information will improve Description: Including pain rating scale, medication(s)/side effects and non-pharmacologic comfort measures Outcome: Progressing   Problem: Health Behavior/Discharge Planning: Goal: Ability to manage health-related needs will improve Outcome: Progressing   Problem: Education: Goal: Knowledge of risk factors and measures for prevention of condition will improve Outcome: Progressing   Problem: Coping: Goal: Psychosocial and spiritual needs will be supported Outcome: Progressing   

## 2020-06-21 NOTE — Discharge Summary (Signed)
Physician Discharge Summary  Mathew Bernard:[id number] DOB: January 06, 1951 DOA: 06/19/2020  PCP: Estanislado Pandy, MD  Admit date: 06/19/2020  Discharge date: 06/21/2020  Admitted From:Home  Disposition:  Home  Recommendations for Outpatient Follow-up:  1. Follow up with PCP in 1-2 weeks 2. Continue on prednisone as prescribed for several more days to complete course of treatment 3. Use albuterol inhaler as needed for shortness of breath or wheezing  Home Health: None  Equipment/Devices: Home 2 L nasal cannula oxygen  Discharge Condition:Stable  CODE STATUS: DNI  Diet recommendation: Heart Healthy/Carb modified  Brief/Interim Summary: Mathew Bernard is a 70yo male with a history of CVA (2014), hypothyroidism, OSA, DM2 and depression who was admitted with acute hypoxemic respiratory failure secondary to COVID-19 pneumonia. He has had symptoms for approximately 2 weeks and therefore was not started on remdesivir. He was requiring 2 L nasal cannula oxygen and did not have any significant respiratory distress. He was started on IV steroids and required monitoring of his inflammatory marker trend which has now shown a downward trend. He has overall done well throughout the course of this hospitalization, but will require some home oxygen on discharge. He is otherwise in stable condition.  Discharge Diagnoses:  Active Problems:   OSA (obstructive sleep apnea)   Essential hypertension, benign   Acute hypoxemic respiratory failure due to COVID-19 (HCC)   Depression   Diabetes (HCC)   GERD (gastroesophageal reflux disease)   Hypothyroidism  Principal discharge diagnosis: Acute hypoxemic respiratory failure secondary to COVID-19 pneumonia.  Discharge Instructions  Discharge Instructions    Diet - low sodium heart healthy   Complete by: As directed    Increase activity slowly   Complete by: As directed      Allergies as of 06/21/2020      Reactions   Alteplase    Lip and tongue  swelling   Benadryl [diphenhydramine Hcl] Other (See Comments)   Restlessness, anxiety   Codeine    Jittery       Medication List    STOP taking these medications   pravastatin 40 MG tablet Commonly known as: PRAVACHOL     TAKE these medications   albuterol 108 (90 Base) MCG/ACT inhaler Commonly known as: VENTOLIN HFA Inhale 2 puffs into the lungs every 6 (six) hours as needed for wheezing.   amLODipine-benazepril 5-10 MG capsule Commonly known as: LOTREL Take 1 capsule by mouth daily.   aspirin EC 81 MG tablet Take 1 tablet (81 mg total) by mouth daily.   cetirizine 10 MG tablet Commonly known as: ZYRTEC Take 10 mg by mouth daily as needed for allergies.   co-enzyme Q-10 50 MG capsule Take 50 mg by mouth daily.   dextromethorphan-guaiFENesin 30-600 MG 12hr tablet Commonly known as: MUCINEX DM Take 1 tablet by mouth 2 (two) times daily for 10 days.   Fish Oil 1200 MG Caps Take 2,400 mg by mouth 2 (two) times daily.   fluticasone 50 MCG/ACT nasal spray Commonly known as: FLONASE Place 2 sprays into the nose as needed for rhinitis.   levothyroxine 50 MCG tablet Commonly known as: SYNTHROID Take 50 mcg by mouth daily before breakfast.   metFORMIN 500 MG 24 hr tablet Commonly known as: GLUCOPHAGE-XR Take 500 mg by mouth 2 (two) times daily.   omeprazole 20 MG capsule Commonly known as: PRILOSEC Take 20 mg by mouth 2 (two) times daily.   predniSONE 10 MG tablet Commonly known as: DELTASONE Take 4 tablets (40 mg total)  by mouth daily for 7 days.   sertraline 100 MG tablet Commonly known as: ZOLOFT Take 150 mg by mouth at bedtime.   simvastatin 40 MG tablet Commonly known as: ZOCOR Take 40 mg by mouth at bedtime.   Stiolto Respimat 2.5-2.5 MCG/ACT Aers Generic drug: Tiotropium Bromide-Olodaterol Inhale 2 puffs into the lungs daily.   vitamin B-12 1000 MCG tablet Commonly known as: CYANOCOBALAMIN Take 1,000 mcg by mouth daily.             Durable Medical Equipment  (From admission, onward)         Start     Ordered   06/21/20 0936  For home use only DME oxygen  Once       Question Answer Comment  Length of Need 6 Months   Mode or (Route) Nasal cannula   Liters per Minute 2   Frequency Continuous (stationary and portable oxygen unit needed)   Oxygen conserving device Yes   Oxygen delivery system Gas      06/21/20 0935          Follow-up Information    Sasser, Clarene Critchley, MD Follow up in 1 week(s).   Specialty: Family Medicine Contact information: [address] [place] [address] [phone]              Allergies  Allergen Reactions  . Alteplase     Lip and tongue swelling  . Benadryl [Diphenhydramine Hcl] Other (See Comments)    Restlessness, anxiety  . Codeine     Jittery     Consultations:  None   Procedures/Studies: CT Angio Chest PE W/Cm &/Or Wo Cm  Result Date: 06/19/2020 CLINICAL DATA:  70 year old male diagnosed with COVID-19 2 weeks ago. Increased cough and shortness of breath. EXAM: CT ANGIOGRAPHY CHEST WITH CONTRAST TECHNIQUE: Multidetector CT imaging of the chest was performed using the standard protocol during bolus administration of intravenous contrast. Multiplanar CT image reconstructions and MIPs were obtained to evaluate the vascular anatomy. CONTRAST:  OMNIPAQUE IOHEXOL 350 MG/ML SOLN COMPARISON:  Portable chest x-ray this morning. Report of chest CT without contrast 05/03/2012 (no images available). FINDINGS: Cardiovascular: Adequate contrast bolus timing in the pulmonary arterial tree. Mild respiratory motion. No focal filling defect identified in the pulmonary arteries to suggest acute pulmonary embolism. Extensive calcified coronary artery atherosclerosis (series 6, image 132). No cardiomegaly or pericardial effusion. Negative visible aorta aside from atherosclerosis. Mediastinum/Nodes: Reactive appearing mediastinal lymph nodes, up to 13 mm in the anterior carina nodal  station. Subcentimeter nodes otherwise. Similar mild reactive appearing hilar lymph nodes. Lungs/Pleura: Major airways are patent.  No pleural effusion. Patchy, widely scattered bilateral peribronchial ground-glass and other sub solid pulmonary opacity. Bilateral middle lobes relatively spared. Upper lobes and superior segment lower lobes most affected. Superimposed mild dependent atelectasis. No areas of consolidation at this time. Upper Abdomen: Negative visible liver (punctate left hepatic lobe calcified granuloma), spleen, pancreas, adrenal glands and bowel in the upper abdomen. Partially visible right upper pole renal cyst with simple fluid density. Musculoskeletal: Lower cervical ACDF. Mildly exaggerated thoracic kyphosis. Upper thoracic posterior element degeneration including bulky calcification of ligament flavum. No acute osseous abnormality identified. Review of the MIP images confirms the above findings. IMPRESSION: 1. Negative for acute pulmonary embolus. 2. Widely scattered patchy pulmonary opacity typical for COVID-19 pneumonia. Reactive appearing mediastinal and hilar lymph nodes. 3. Calcified coronary artery and Aortic Atherosclerosis (ICD10-I70.0). Electronically Signed   By: Odessa Fleming M.D.   On: 06/19/2020 09:21  DG Chest Port 1 View  Result Date: 06/19/2020 CLINICAL DATA:  Cough.  History of COVID. EXAM: PORTABLE CHEST 1 VIEW COMPARISON:  Chest x-ray 11/14/2012. FINDINGS: Heart size normal. Lung volumes. Diffuse mild bilateral interstitial prominence consistent with pneumonitis. No pleural effusion or pneumothorax. Cervical spine fusion. Degenerative change thoracic spine. IMPRESSION: Diffuse mild bilateral interstitial prominence consistent with pneumonitis. Electronically Signed   By: Maisie Fus  Register   On: 06/19/2020 06:38      Discharge Exam: Vitals:   06/20/20 2221 06/21/20 0520  BP: 123/63 125/65  Pulse: (!) 57 (!) 52  Resp: 18 18  Temp: 98.1 F (36.7 C) 97.8 F (36.6 C)   SpO2: 94% 91%   Vitals:   06/20/20 1748 06/20/20 2056 06/20/20 2221 06/21/20 0520  BP: 102/66  123/63 125/65  Pulse: 62  (!) 57 (!) 52  Resp:   18 18  Temp: 98.2 F (36.8 C)  98.1 F (36.7 C) 97.8 F (36.6 C)  TempSrc: Oral     SpO2: 92% 92% 94% 91%  Weight:      Height:        General: Pt is alert, awake, not in acute distress Cardiovascular: RRR, S1/S2 +, no rubs, no gallops Respiratory: CTA bilaterally, no wheezing, no rhonchi, on 2 L nasal cannula oxygen. Abdominal: Soft, NT, ND, bowel sounds + Extremities: no edema, no cyanosis    The results of significant diagnostics from this hospitalization (including imaging, microbiology, ancillary and laboratory) are listed below for reference.     Microbiology: Recent Results (from the past 240 hour(s))  Blood Culture (routine x 2)     Status: None (Preliminary result)   Collection Time: 06/19/20  6:07 AM   Specimen: BLOOD  Result Value Ref Range Status   Specimen Description BLOOD LEFT ANTECUBITAL  Final   Special Requests   Final    BOTTLES DRAWN AEROBIC AND ANAEROBIC Blood Culture results may not be optimal due to an excessive volume of blood received in culture bottles   Culture   Final    NO GROWTH 2 DAYS Performed at [facility], [address], [place], [place] [address]    Report Status PENDING  Incomplete  Blood Culture (routine x 2)     Status: None (Preliminary result)   Collection Time: 06/19/20  6:07 AM   Specimen: BLOOD RIGHT HAND  Result Value Ref Range Status   Specimen Description BLOOD RIGHT HAND  Final   Special Requests   Final    BOTTLES DRAWN AEROBIC AND ANAEROBIC Blood Culture adequate volume   Culture   Final    NO GROWTH 2 DAYS Performed at [facility], [address], [place], [place] [address]    Report Status PENDING  Incomplete  Resp Panel by RT-PCR (Flu A&B, Covid) Nasopharyngeal Swab     Status: Abnormal   Collection Time: 06/19/20  6:07 AM   Specimen: Nasopharyngeal Swab;  Nasopharyngeal(NP) swabs in vial transport medium  Result Value Ref Range Status   SARS Coronavirus 2 by RT PCR POSITIVE (A) NEGATIVE Final    Comment: RESULT CALLED TO, READ BACK BY AND VERIFIED WITH: MICHAEL DOSS 06/19/20 @ 0830 BY S. BEARD (NOTE) SARS-CoV-2 target nucleic acids are DETECTED.  The SARS-CoV-2 RNA is generally detectable in upper respiratory specimens during the acute phase of infection. Positive results are indicative of the presence of the identified virus, but do not rule out bacterial infection or co-infection with other pathogens not detected by the test. Clinical correlation with patient  history and other diagnostic information is necessary to determine patient infection status. The expected result is Negative.  Fact Sheet for Patients: BloggerCourse.comhttps://www.fda.gov/media/152166/download  Fact Sheet for Healthcare Providers: SeriousBroker.ithttps://www.fda.gov/media/152162/download  This test is not yet approved or cleared by the [place] FDA and  has been authorized for detection and/or diagnosis of SARS-CoV-2 by FDA under an Emergency Use Authorization (EUA).  This EUA will remain in effect (meaning this test  can be used) for the duration of  the COVID-19 declaration under Section 564(b)(1) of the Act, 21 U.S.C. section 360bbb-3(b)(1), unless the authorization is terminated or revoked sooner.     Influenza A by PCR NEGATIVE NEGATIVE Final   Influenza B by PCR NEGATIVE NEGATIVE Final    Comment: (NOTE) The Xpert Xpress SARS-CoV-2/FLU/RSV plus assay is intended as an aid in the diagnosis of influenza from Nasopharyngeal swab specimens and should not be used as a sole basis for treatment. Nasal washings and aspirates are unacceptable for Xpert Xpress SARS-CoV-2/FLU/RSV testing.  Fact Sheet for Patients: BloggerCourse.comhttps://www.fda.gov/media/152166/download  Fact Sheet for Healthcare Providers: SeriousBroker.ithttps://www.fda.gov/media/152162/download  This test is not yet approved or cleared by  the [place] FDA and has been authorized for detection and/or diagnosis of SARS-CoV-2 by FDA under an Emergency Use Authorization (EUA). This EUA will remain in effect (meaning this test can be used) for the duration of the COVID-19 declaration under Section 564(b)(1) of the Act, 21 U.S.C. section 360bbb-3(b)(1), unless the authorization is terminated or revoked.  Performed at [facility], [address] [place], [place] [id number]      Labs: BNP (last 3 results) No results for input(s): BNP in the last 8760 hours. Basic Metabolic Panel: Recent Labs  Lab 06/19/20 0607 06/19/20 1217 06/20/20 0604 06/21/20 0638  NA 137  --  137 140  K 3.5  --  4.0 4.2  CL 103  --  104 104  CO2 24  --  24 24  GLUCOSE 176*  --  190* 200*  BUN 24*  --  26* 31*  CREATININE 1.03 0.98 0.95 1.01  CALCIUM 8.5*  --  8.8* 8.9   Liver Function Tests: Recent Labs  Lab 06/19/20 0607 06/20/20 0604 06/21/20 0638  AST 36 42* 49*  ALT 35 35 45*  ALKPHOS 75 70 63  BILITOT 0.8 0.7 0.8  PROT 6.7 6.7 6.3*  ALBUMIN 3.4* 3.3* 3.1*   No results for input(s): LIPASE, AMYLASE in the last 168 hours. No results for input(s): AMMONIA in the last 168 hours. CBC: Recent Labs  Lab 06/19/20 0607 06/20/20 0604 06/21/20 0638  WBC 4.1 3.7* 5.5  NEUTROABS 3.3  --   --   HGB 13.6 13.6 13.2  HCT 41.9 42.9 41.1  MCV 86.2 86.1 86.2  PLT 120* 120* 139*   Cardiac Enzymes: No results for input(s): CKTOTAL, CKMB, CKMBINDEX, TROPONINI in the last 168 hours. BNP: Invalid input(s): POCBNP CBG: Recent Labs  Lab 06/20/20 0828 06/20/20 1203 06/20/20 1613 06/20/20 2222 06/21/20 0751  GLUCAP 214* 101* 201* 152* 182*   D-Dimer Recent Labs    06/20/20 0604 06/21/20 0638  DDIMER 2.07* 1.92*   Hgb A1c No results for input(s): HGBA1C in the last 72 hours. Lipid Profile Recent Labs    06/19/20 0607  TRIG 121   Thyroid function studies No results for input(s): TSH, T4TOTAL, T3FREE, THYROIDAB in the  last 72 hours.  Invalid input(s): FREET3 Anemia work up [facility]    06/20/20 0604 06/21/20 0638  FERRITIN 545* 489*  Urinalysis    Component Value Date/Time   COLORURINE YELLOW 11/14/2012 1508   APPEARANCEUR CLEAR 11/14/2012 1508   LABSPEC 1.013 11/14/2012 1508   PHURINE 6.0 11/14/2012 1508   GLUCOSEU NEGATIVE 11/14/2012 1508   HGBUR NEGATIVE 11/14/2012 1508   BILIRUBINUR NEGATIVE 11/14/2012 1508   KETONESUR NEGATIVE 11/14/2012 1508   PROTEINUR NEGATIVE 11/14/2012 1508   UROBILINOGEN 0.2 11/14/2012 1508   NITRITE NEGATIVE 11/14/2012 1508   LEUKOCYTESUR NEGATIVE 11/14/2012 1508   Sepsis Labs Invalid input(s): PROCALCITONIN,  WBC,  LACTICIDVEN Microbiology Recent Results (from the past 240 hour(s))  Blood Culture (routine x 2)     Status: None (Preliminary result)   Collection Time: 06/19/20  6:07 AM   Specimen: BLOOD  Result Value Ref Range Status   Specimen Description BLOOD LEFT ANTECUBITAL  Final   Special Requests   Final    BOTTLES DRAWN AEROBIC AND ANAEROBIC Blood Culture results may not be optimal due to an excessive volume of blood received in culture bottles   Culture   Final    NO GROWTH 2 DAYS Performed at [facility], [address], [place], [place] [address]    Report Status PENDING  Incomplete  Blood Culture (routine x 2)     Status: None (Preliminary result)   Collection Time: 06/19/20  6:07 AM   Specimen: BLOOD RIGHT HAND  Result Value Ref Range Status   Specimen Description BLOOD RIGHT HAND  Final   Special Requests   Final    BOTTLES DRAWN AEROBIC AND ANAEROBIC Blood Culture adequate volume   Culture   Final    NO GROWTH 2 DAYS Performed at [facility], [address] [place], [place] [address]    Report Status PENDING  Incomplete  Resp Panel by RT-PCR (Flu A&B, Covid) Nasopharyngeal Swab     Status: Abnormal   Collection Time: 06/19/20  6:07 AM   Specimen: Nasopharyngeal Swab; Nasopharyngeal(NP) swabs in vial transport medium  Result  Value Ref Range Status   SARS Coronavirus 2 by RT PCR POSITIVE (A) NEGATIVE Final    Comment: RESULT CALLED TO, READ BACK BY AND VERIFIED WITH: MICHAEL DOSS 06/19/20 @ 0830 BY S. BEARD (NOTE) SARS-CoV-2 target nucleic acids are DETECTED.  The SARS-CoV-2 RNA is generally detectable in upper respiratory specimens during the acute phase of infection. Positive results are indicative of the presence of the identified virus, but do not rule out bacterial infection or co-infection with other pathogens not detected by the test. Clinical correlation with patient history and other diagnostic information is necessary to determine patient infection status. The expected result is Negative.  Fact Sheet for Patients: BloggerCourse.com  Fact Sheet for Healthcare Providers: SeriousBroker.it  This test is not yet approved or cleared by the [place] FDA and  has been authorized for detection and/or diagnosis of SARS-CoV-2 by FDA under an Emergency Use Authorization (EUA).  This EUA will remain in effect (meaning this test  can be used) for the duration of  the COVID-19 declaration under Section 564(b)(1) of the Act, 21 U.S.C. section 360bbb-3(b)(1), unless the authorization is terminated or revoked sooner.     Influenza A by PCR NEGATIVE NEGATIVE Final   Influenza B by PCR NEGATIVE NEGATIVE Final    Comment: (NOTE) The Xpert Xpress SARS-CoV-2/FLU/RSV plus assay is intended as an aid in the diagnosis of influenza from Nasopharyngeal swab specimens and should not be used as a sole basis for treatment. Nasal washings and aspirates are unacceptable for Xpert Xpress SARS-CoV-2/FLU/RSV testing.  Fact Sheet for Patients: BloggerCourse.com  Fact Sheet for Healthcare Providers: SeriousBroker.it  This test is not yet approved or cleared by the [place] FDA and has been authorized for detection  and/or diagnosis of SARS-CoV-2 by FDA under an Emergency Use Authorization (EUA). This EUA will remain in effect (meaning this test can be used) for the duration of the COVID-19 declaration under Section 564(b)(1) of the Act, 21 U.S.C. section 360bbb-3(b)(1), unless the authorization is terminated or revoked.  Performed at [facility], [address], [place], [place] [address]      Time coordinating discharge: 35 minutes  SIGNED:   Erick Blinks, DO [facility] 06/21/2020, 9:38 AM  If 7PM-7AM, please contact night-coverage [email or web address]

## 2020-06-21 NOTE — Plan of Care (Signed)
  Problem: Education: Goal: Knowledge of General Education information will improve Description: Including pain rating scale, medication(s)/side effects and non-pharmacologic comfort measures 06/21/2020 1136 by Karolee Ohs, RN Outcome: Adequate for Discharge 06/21/2020 1135 by Karolee Ohs, RN Outcome: Progressing   Problem: Clinical Measurements: Goal: Ability to maintain clinical measurements within normal limits will improve Outcome: Adequate for Discharge Goal: Will remain free from infection Outcome: Adequate for Discharge Goal: Diagnostic test results will improve Outcome: Adequate for Discharge Goal: Respiratory complications will improve Outcome: Adequate for Discharge Goal: Cardiovascular complication will be avoided Outcome: Adequate for Discharge   Problem: Activity: Goal: Risk for activity intolerance will decrease Outcome: Adequate for Discharge   Problem: Nutrition: Goal: Adequate nutrition will be maintained Outcome: Adequate for Discharge   Problem: Coping: Goal: Level of anxiety will decrease Outcome: Adequate for Discharge   Problem: Elimination: Goal: Will not experience complications related to bowel motility Outcome: Adequate for Discharge Goal: Will not experience complications related to urinary retention Outcome: Adequate for Discharge   Problem: Pain Managment: Goal: General experience of comfort will improve Outcome: Adequate for Discharge   Problem: Safety: Goal: Ability to remain free from injury will improve Outcome: Adequate for Discharge   Problem: Skin Integrity: Goal: Risk for impaired skin integrity will decrease Outcome: Adequate for Discharge   Problem: Education: Goal: Knowledge of risk factors and measures for prevention of condition will improve 06/21/2020 1136 by Karolee Ohs, RN Outcome: Adequate for Discharge 06/21/2020 1135 by Karolee Ohs, RN Outcome: Progressing   Problem: Coping: Goal: Psychosocial and  spiritual needs will be supported 06/21/2020 1136 by Karolee Ohs, RN Outcome: Adequate for Discharge 06/21/2020 1135 by Karolee Ohs, RN Outcome: Progressing   Problem: Respiratory: Goal: Will maintain a patent airway Outcome: Adequate for Discharge Goal: Complications related to the disease process, condition or treatment will be avoided or minimized Outcome: Adequate for Discharge

## 2020-06-24 DIAGNOSIS — U071 COVID-19: Secondary | ICD-10-CM | POA: Diagnosis not present

## 2020-06-24 DIAGNOSIS — J9601 Acute respiratory failure with hypoxia: Secondary | ICD-10-CM | POA: Diagnosis not present

## 2020-06-24 LAB — CULTURE, BLOOD (ROUTINE X 2)
Culture: NO GROWTH
Culture: NO GROWTH
Special Requests: ADEQUATE

## 2020-07-10 DIAGNOSIS — E7849 Other hyperlipidemia: Secondary | ICD-10-CM | POA: Diagnosis not present

## 2020-07-10 DIAGNOSIS — I10 Essential (primary) hypertension: Secondary | ICD-10-CM | POA: Diagnosis not present

## 2020-07-10 DIAGNOSIS — E7800 Pure hypercholesterolemia, unspecified: Secondary | ICD-10-CM | POA: Diagnosis not present

## 2020-07-10 DIAGNOSIS — E1122 Type 2 diabetes mellitus with diabetic chronic kidney disease: Secondary | ICD-10-CM | POA: Diagnosis not present

## 2020-07-10 DIAGNOSIS — E1165 Type 2 diabetes mellitus with hyperglycemia: Secondary | ICD-10-CM | POA: Diagnosis not present

## 2020-07-10 DIAGNOSIS — N183 Chronic kidney disease, stage 3 unspecified: Secondary | ICD-10-CM | POA: Diagnosis not present

## 2020-07-10 DIAGNOSIS — E782 Mixed hyperlipidemia: Secondary | ICD-10-CM | POA: Diagnosis not present

## 2020-07-10 DIAGNOSIS — K2100 Gastro-esophageal reflux disease with esophagitis, without bleeding: Secondary | ICD-10-CM | POA: Diagnosis not present

## 2020-07-10 DIAGNOSIS — E7801 Familial hypercholesterolemia: Secondary | ICD-10-CM | POA: Diagnosis not present

## 2020-07-10 DIAGNOSIS — E039 Hypothyroidism, unspecified: Secondary | ICD-10-CM | POA: Diagnosis not present

## 2020-07-15 DIAGNOSIS — E1165 Type 2 diabetes mellitus with hyperglycemia: Secondary | ICD-10-CM | POA: Diagnosis not present

## 2020-07-15 DIAGNOSIS — E7849 Other hyperlipidemia: Secondary | ICD-10-CM | POA: Diagnosis not present

## 2020-07-15 DIAGNOSIS — J449 Chronic obstructive pulmonary disease, unspecified: Secondary | ICD-10-CM | POA: Diagnosis not present

## 2020-07-15 DIAGNOSIS — E1122 Type 2 diabetes mellitus with diabetic chronic kidney disease: Secondary | ICD-10-CM | POA: Diagnosis not present

## 2020-07-15 DIAGNOSIS — I10 Essential (primary) hypertension: Secondary | ICD-10-CM | POA: Diagnosis not present

## 2020-07-15 DIAGNOSIS — Z1389 Encounter for screening for other disorder: Secondary | ICD-10-CM | POA: Diagnosis not present

## 2020-07-15 DIAGNOSIS — Z1331 Encounter for screening for depression: Secondary | ICD-10-CM | POA: Diagnosis not present

## 2020-07-15 DIAGNOSIS — F3342 Major depressive disorder, recurrent, in full remission: Secondary | ICD-10-CM | POA: Diagnosis not present

## 2020-07-21 DIAGNOSIS — J1282 Pneumonia due to coronavirus disease 2019: Secondary | ICD-10-CM | POA: Diagnosis not present

## 2020-07-21 DIAGNOSIS — E1165 Type 2 diabetes mellitus with hyperglycemia: Secondary | ICD-10-CM | POA: Diagnosis not present

## 2020-07-21 DIAGNOSIS — E039 Hypothyroidism, unspecified: Secondary | ICD-10-CM | POA: Diagnosis not present

## 2020-07-21 DIAGNOSIS — E1122 Type 2 diabetes mellitus with diabetic chronic kidney disease: Secondary | ICD-10-CM | POA: Diagnosis not present

## 2020-07-21 DIAGNOSIS — J449 Chronic obstructive pulmonary disease, unspecified: Secondary | ICD-10-CM | POA: Diagnosis not present

## 2020-07-22 DIAGNOSIS — J9601 Acute respiratory failure with hypoxia: Secondary | ICD-10-CM | POA: Diagnosis not present

## 2020-07-22 DIAGNOSIS — U071 COVID-19: Secondary | ICD-10-CM | POA: Diagnosis not present

## 2020-07-28 DIAGNOSIS — R001 Bradycardia, unspecified: Secondary | ICD-10-CM | POA: Diagnosis not present

## 2020-07-28 DIAGNOSIS — E785 Hyperlipidemia, unspecified: Secondary | ICD-10-CM | POA: Diagnosis not present

## 2020-07-28 DIAGNOSIS — I10 Essential (primary) hypertension: Secondary | ICD-10-CM | POA: Diagnosis not present

## 2020-07-28 DIAGNOSIS — S61112A Laceration without foreign body of left thumb with damage to nail, initial encounter: Secondary | ICD-10-CM | POA: Diagnosis not present

## 2020-07-28 DIAGNOSIS — Z7982 Long term (current) use of aspirin: Secondary | ICD-10-CM | POA: Diagnosis not present

## 2020-07-28 DIAGNOSIS — N1330 Unspecified hydronephrosis: Secondary | ICD-10-CM | POA: Diagnosis not present

## 2020-07-28 DIAGNOSIS — E119 Type 2 diabetes mellitus without complications: Secondary | ICD-10-CM | POA: Diagnosis not present

## 2020-07-28 DIAGNOSIS — Z8673 Personal history of transient ischemic attack (TIA), and cerebral infarction without residual deficits: Secondary | ICD-10-CM | POA: Diagnosis not present

## 2020-07-28 DIAGNOSIS — X58XXXA Exposure to other specified factors, initial encounter: Secondary | ICD-10-CM | POA: Diagnosis not present

## 2020-07-28 DIAGNOSIS — Z87891 Personal history of nicotine dependence: Secondary | ICD-10-CM | POA: Diagnosis not present

## 2020-07-28 DIAGNOSIS — N281 Cyst of kidney, acquired: Secondary | ICD-10-CM | POA: Diagnosis not present

## 2020-07-28 DIAGNOSIS — R109 Unspecified abdominal pain: Secondary | ICD-10-CM | POA: Diagnosis not present

## 2020-07-28 DIAGNOSIS — N200 Calculus of kidney: Secondary | ICD-10-CM | POA: Diagnosis not present

## 2020-07-28 DIAGNOSIS — N202 Calculus of kidney with calculus of ureter: Secondary | ICD-10-CM | POA: Diagnosis not present

## 2020-07-29 DIAGNOSIS — Z87442 Personal history of urinary calculi: Secondary | ICD-10-CM | POA: Diagnosis not present

## 2020-07-29 DIAGNOSIS — Z885 Allergy status to narcotic agent status: Secondary | ICD-10-CM | POA: Diagnosis not present

## 2020-07-29 DIAGNOSIS — R109 Unspecified abdominal pain: Secondary | ICD-10-CM | POA: Diagnosis not present

## 2020-07-29 DIAGNOSIS — N23 Unspecified renal colic: Secondary | ICD-10-CM | POA: Diagnosis not present

## 2020-07-30 ENCOUNTER — Encounter (HOSPITAL_COMMUNITY): Payer: Self-pay

## 2020-07-30 ENCOUNTER — Other Ambulatory Visit: Payer: Self-pay

## 2020-07-30 ENCOUNTER — Emergency Department (HOSPITAL_COMMUNITY)
Admission: EM | Admit: 2020-07-30 | Discharge: 2020-07-30 | Disposition: A | Discharge status: Home / Self Care | Payer: Medicare PPO | Attending: Emergency Medicine | Admitting: Emergency Medicine

## 2020-07-30 DIAGNOSIS — E1122 Type 2 diabetes mellitus with diabetic chronic kidney disease: Secondary | ICD-10-CM | POA: Insufficient documentation

## 2020-07-30 DIAGNOSIS — N189 Chronic kidney disease, unspecified: Secondary | ICD-10-CM | POA: Insufficient documentation

## 2020-07-30 DIAGNOSIS — N179 Acute kidney failure, unspecified: Secondary | ICD-10-CM | POA: Insufficient documentation

## 2020-07-30 DIAGNOSIS — J449 Chronic obstructive pulmonary disease, unspecified: Secondary | ICD-10-CM | POA: Diagnosis not present

## 2020-07-30 DIAGNOSIS — Z7984 Long term (current) use of oral hypoglycemic drugs: Secondary | ICD-10-CM | POA: Insufficient documentation

## 2020-07-30 DIAGNOSIS — K219 Gastro-esophageal reflux disease without esophagitis: Secondary | ICD-10-CM | POA: Diagnosis not present

## 2020-07-30 DIAGNOSIS — E039 Hypothyroidism, unspecified: Secondary | ICD-10-CM | POA: Insufficient documentation

## 2020-07-30 DIAGNOSIS — I10 Essential (primary) hypertension: Secondary | ICD-10-CM | POA: Diagnosis not present

## 2020-07-30 DIAGNOSIS — Z87891 Personal history of nicotine dependence: Secondary | ICD-10-CM | POA: Insufficient documentation

## 2020-07-30 DIAGNOSIS — N23 Unspecified renal colic: Secondary | ICD-10-CM | POA: Diagnosis not present

## 2020-07-30 DIAGNOSIS — Z87442 Personal history of urinary calculi: Secondary | ICD-10-CM | POA: Diagnosis not present

## 2020-07-30 DIAGNOSIS — R109 Unspecified abdominal pain: Secondary | ICD-10-CM | POA: Diagnosis not present

## 2020-07-30 DIAGNOSIS — Z7982 Long term (current) use of aspirin: Secondary | ICD-10-CM | POA: Diagnosis not present

## 2020-07-30 DIAGNOSIS — Z8616 Personal history of COVID-19: Secondary | ICD-10-CM | POA: Diagnosis not present

## 2020-07-30 DIAGNOSIS — Z79899 Other long term (current) drug therapy: Secondary | ICD-10-CM | POA: Diagnosis not present

## 2020-07-30 DIAGNOSIS — I129 Hypertensive chronic kidney disease with stage 1 through stage 4 chronic kidney disease, or unspecified chronic kidney disease: Secondary | ICD-10-CM | POA: Diagnosis not present

## 2020-07-30 LAB — BASIC METABOLIC PANEL
Anion gap: 7 (ref 5–15)
BUN: 23 mg/dL (ref 8–23)
CO2: 24 mmol/L (ref 22–32)
Calcium: 9.9 mg/dL (ref 8.9–10.3)
Chloride: 104 mmol/L (ref 98–111)
Creatinine, Ser: 2.04 mg/dL — ABNORMAL HIGH (ref 0.61–1.24)
GFR, Estimated: 34 mL/min — ABNORMAL LOW (ref >60)
Glucose, Bld: 152 mg/dL — ABNORMAL HIGH (ref 70–99)
Potassium: 5.2 mmol/L — ABNORMAL HIGH (ref 3.5–5.1)
Sodium: 135 mmol/L (ref 135–145)

## 2020-07-30 LAB — URINALYSIS, ROUTINE W REFLEX MICROSCOPIC
Bilirubin Urine: NEGATIVE
Glucose, UA: NEGATIVE mg/dL
Hgb urine dipstick: NEGATIVE
Ketones, ur: NEGATIVE mg/dL
Leukocytes,Ua: NEGATIVE
Nitrite: NEGATIVE
Protein, ur: NEGATIVE mg/dL
Specific Gravity, Urine: 1.025 (ref 1.005–1.030)
pH: 5 (ref 5.0–8.0)

## 2020-07-30 LAB — CBC WITH DIFFERENTIAL/PLATELET
Abs Immature Granulocytes: 0.03 10*3/uL (ref 0.00–0.07)
Basophils Absolute: 0 10*3/uL (ref 0.0–0.1)
Basophils Relative: 0 %
Eosinophils Absolute: 0.1 10*3/uL (ref 0.0–0.5)
Eosinophils Relative: 1 %
HCT: 39.6 % (ref 39.0–52.0)
Hemoglobin: 12.5 g/dL — ABNORMAL LOW (ref 13.0–17.0)
Immature Granulocytes: 0 %
Lymphocytes Relative: 8 %
Lymphs Abs: 0.8 10*3/uL (ref 0.7–4.0)
MCH: 27.3 pg (ref 26.0–34.0)
MCHC: 31.6 g/dL (ref 30.0–36.0)
MCV: 86.5 fL (ref 80.0–100.0)
Monocytes Absolute: 0.7 10*3/uL (ref 0.1–1.0)
Monocytes Relative: 8 %
Neutro Abs: 7.6 10*3/uL (ref 1.7–7.7)
Neutrophils Relative %: 83 %
Platelets: 190 10*3/uL (ref 150–400)
RBC: 4.58 MIL/uL (ref 4.22–5.81)
RDW: 14.8 % (ref 11.5–15.5)
WBC: 9.2 10*3/uL (ref 4.0–10.5)
nRBC: 0 % (ref 0.0–0.2)

## 2020-07-30 MED ORDER — OXYCODONE HCL 5 MG PO TABS: 5.0000 mg | ORAL_TABLET | ORAL | 0 refills | Status: DC | PRN

## 2020-07-30 MED ORDER — SODIUM CHLORIDE 0.9 % IV BOLUS
500.0000 mL | Freq: Once | INTRAVENOUS | Status: AC
  Administered 2020-07-30: 500 mL via INTRAVENOUS

## 2020-07-30 MED ORDER — OXYCODONE HCL 5 MG PO TABS
5.0000 mg | ORAL_TABLET | Freq: Once | ORAL | Status: AC
  Administered 2020-07-30: 5 mg via ORAL
  Filled 2020-07-30: qty 1

## 2020-07-30 NOTE — ED Triage Notes (Signed)
Emergency Medicine Provider Triage Evaluation Note  Mathew Bernard , a 70 y.o. male  was evaluated in triage.  Pt complains of ongoing pain from kindey stone.  This is his 3rd visit in 3 days.  He was seen two days ago and had CT scan showing normal aorta.  No fevers.  He has been taking pain medication at home.  .   CT Abdomen Pelvis Wo Contrast  Result Date: 07/28/2020 CLINICAL DATA: RIGHT flank pain EXAM: CT ABDOMEN AND PELVIS WITHOUT CONTRAST TECHNIQUE: Multidetector CT imaging of the abdomen and pelvis was performed following the standard protocol without IV contrast. COMPARISON: None. FINDINGS: Lower chest: Lung bases are clear. Hepatobiliary: No focal hepatic lesion. No biliary duct dilatation. Common bile duct is normal. Pancreas: Pancreas is normal. No ductal dilatation. No pancreatic inflammation. Spleen: Normal spleen Adrenals/urinary tract: Adrenal glands normal. Mild hydroureter on the RIGHT. Mild ureteral stranding. Partially obstructing calculus in the distal RIGHT ureter measures 4 mm on image 68/2. This calculus is below the pelvic brim at S3 vertebral body level. Additional bilateral nonobstructing renal calculi. There bilateral simple fluid attenuation renal cyst lesions. No bladder calculi. Stomach/Bowel: Stomach, small-bowel and cecum are normal. The appendix is not identified but there is no pericecal inflammation to suggest appendicitis. The colon and rectosigmoid colon are normal. Vascular/Lymphatic: Abdominal aorta is normal caliber with atherosclerotic calcification. There is no retroperitoneal or periportal lymphadenopathy. No pelvic lymphadenopathy. Reproductive: Prostate unremarkable Other: No free fluid. Musculoskeletal: No aggressive osseous lesion.   1. Partially obstructing calculus in the distal RIGHT ureter. 2. Bilateral nephrolithiasis. Electronically Signed By: Genevive Bi M.D. On: 07/28/2020 17:59    Physical Exam  BP (!) 115/91 (BP Location: Right Arm)   Pulse  83   Resp 18   Ht 5\' 8"  (1.727 m)   Wt 96.2 kg   SpO2 95%   BMI 32.23 kg/m  Patient is awake and alert.  No distress.  Answers questions appropriately.  Is slightly drowsy but doesn't fall asleep and interacts appropriately.   Generally well appearing  Medical Decision Making  Medically screening exam initiated at 5:28 PM.  Appropriate orders placed.  Mathew Bernard was informed that the remainder of the evaluation will be completed by another provider, this initial triage assessment does not replace that evaluation, and the importance of remaining in the ED until their evaluation is complete.    Elenor Quinones, Cristina Gong 07/30/20 1731

## 2020-07-30 NOTE — ED Provider Notes (Signed)
[facility]-EMERGENCY DEPT Provider Note   CSN: [id number] Arrival date & time: 07/30/20  1700     History Chief Complaint  Patient presents with  . Flank Pain    Mathew Bernard is a 70 y.o. male.  The history is provided by the patient, the spouse and medical records.  Flank Pain   Mathew Bernard is a 70 y.o. male who presents to the Emergency Department complaining of kidney stone.  He presents to the ED accompanied by his wife for pain due to kidney stone.  Three days ago he developed hematuria.  Pain is located in the right flank.  Pain is constant in nature, at times radiates to the RLQ.  Went to ED two days ago and was diagnosed with a kidney stone.  Returned to the ED yesterday due to uncontrolled pain.  Pain was treated and he was discharged.  He returns today due to persistent uncontrolled pain.  Has been taking norco with no improvement in pain.  Went to PCP office today and received a shot of toradol, which helped his pain.  Has an appointment with Urology tomorrow.    Was seen 4/3 at [facility] and started on cipro and clinda (for hand wound) CR 1.6 at that time.  CT with 41mm distal ureteral stone  Denies fever.  Has vomiting.  No dysuria.  Is still urinating but urinating less.    Has a hx/o COPD, recent covid (Feb), DM.  Takes 81mg  ASA.      Past Medical History:  Diagnosis Date  . Anxiety   . Arthritis   . Complication of anesthesia     "depressed after surgery"  . Depression   . Diabetes mellitus without complication (HCC)   . GERD (gastroesophageal reflux disease)   . High cholesterol   . History of kidney stones   . Hypertension   . Hypothyroidism   . Pneumonia    hx of in 70's  . Sleep apnea    CPAP  . Stroke (HCC) 11/14/2012    right basal ganglia infarct  . Thyroid disease     Patient Active Problem List   Diagnosis Date Noted  . Acute hypoxemic respiratory failure due to COVID-19 (HCC) 06/19/2020  . Depression  06/19/2020  . Diabetes (HCC) 06/19/2020  . GERD (gastroesophageal reflux disease) 06/19/2020  . Hypothyroidism 06/19/2020  . OSA (obstructive sleep apnea) 11/16/2012  . Essential hypertension, benign 11/16/2012  . Other and unspecified hyperlipidemia 11/16/2012  . History of stroke 11/16/2012  . HNP (herniated nucleus pulposus), lumbar 10/07/2012    Past Surgical History:  Procedure Laterality Date  . BACK SURGERY  2013   cervical  . KIDNEY STONE SURGERY    . LUMBAR LAMINECTOMY/DECOMPRESSION MICRODISCECTOMY N/A 10/07/2012   Procedure: MICRODISCECTOMY L4-5;  Surgeon: 10/09/2012, MD;  Location: [facility]S;  Service: Orthopedics;  Laterality: N/A;  . nerve surgery     helped with headaches       No family history on file.  Social History   Tobacco Use  . Smoking status: Former Smoker    Years: 40.00    Types: Cigarettes    Quit date: 11/14/2012    Years since quitting: 7.7  . Smokeless tobacco: Never Used  Substance Use Topics  . Alcohol use: No  . Drug use: No    Home Medications Prior to Admission medications   Medication Sig Start Date End Date Taking? Authorizing Provider  oxyCODONE (ROXICODONE) 5 MG immediate release  tablet Take 1 tablet (5 mg total) by mouth every 4 (four) hours as needed for severe pain. 07/30/20  Yes Tilden Fossa, MD  albuterol (PROVENTIL HFA;VENTOLIN HFA) 108 (90 BASE) MCG/ACT inhaler Inhale 2 puffs into the lungs every 6 (six) hours as needed for wheezing.    [provider]  amLODipine-benazepril (LOTREL) 5-10 MG per capsule Take 1 capsule by mouth daily.    [provider]  aspirin EC 81 MG tablet Take 1 tablet (81 mg total) by mouth daily. 01/17/13   Ronal Fear, NP  cetirizine (ZYRTEC) 10 MG tablet Take 10 mg by mouth daily as needed for allergies.     [provider]  co-enzyme Q-10 50 MG capsule Take 50 mg by mouth daily.    [provider]  fluticasone (FLONASE) 50 MCG/ACT nasal spray Place 2 sprays  into the nose as needed for rhinitis.    [provider]  levothyroxine (SYNTHROID, LEVOTHROID) 50 MCG tablet Take 50 mcg by mouth daily before breakfast.    [provider]  metFORMIN (GLUCOPHAGE-XR) 500 MG 24 hr tablet Take 500 mg by mouth 2 (two) times daily. 04/16/20   [provider]  Omega-3 Fatty Acids (FISH OIL) 1200 MG CAPS Take 2,400 mg by mouth 2 (two) times daily.    [provider]  omeprazole (PRILOSEC) 20 MG capsule Take 20 mg by mouth 2 (two) times daily.    [provider]  sertraline (ZOLOFT) 100 MG tablet Take 150 mg by mouth at bedtime.    [provider]  simvastatin (ZOCOR) 40 MG tablet Take 40 mg by mouth at bedtime. 04/16/20   [provider]  STIOLTO RESPIMAT 2.5-2.5 MCG/ACT AERS Inhale 2 puffs into the lungs daily. 06/12/20   [provider]  vitamin B-12 (CYANOCOBALAMIN) 1000 MCG tablet Take 1,000 mcg by mouth daily.    [provider]    Allergies    Alteplase, Benadryl [diphenhydramine hcl], and Codeine  Review of Systems   Review of Systems  Genitourinary: Positive for flank pain.  All other systems reviewed and are negative.   Physical Exam Updated Vital Signs BP 134/78 (BP Location: Right Arm)   Pulse 68   Temp 98.3 F (36.8 C) (Oral)   Resp 18   Ht 5\' 8"  (1.727 m)   Wt 96.2 kg   SpO2 96%   BMI 32.23 kg/m   Physical Exam Vitals and nursing note reviewed.  Constitutional:      Appearance: He is well-developed.  HENT:     Head: Normocephalic and atraumatic.  Cardiovascular:     Rate and Rhythm: Normal rate and regular rhythm.     Heart sounds: No murmur heard.   Pulmonary:     Effort: Pulmonary effort is normal. No respiratory distress.     Breath sounds: Normal breath sounds.  Abdominal:     Palpations: Abdomen is soft.     Tenderness: There is no abdominal tenderness. There is no guarding or rebound.  Musculoskeletal:        General: No tenderness.   Skin:    General: Skin is warm and dry.  Neurological:     Mental Status: He is alert and oriented to person, place, and time.  Psychiatric:        Behavior: Behavior normal.     ED Results / Procedures / Treatments   Labs (all labs ordered are listed, but only abnormal results are displayed) Labs Reviewed  BASIC METABOLIC PANEL - Abnormal;  Notable for the following components:      Result Value   Potassium 5.2 (*)    Glucose, Bld 152 (*)    Creatinine, Ser 2.04 (*)    GFR, Estimated 34 (*)    All other components within normal limits  CBC WITH DIFFERENTIAL/PLATELET - Abnormal; Notable for the following components:   Hemoglobin 12.5 (*)    All other components within normal limits  URINE CULTURE  URINALYSIS, ROUTINE W REFLEX MICROSCOPIC    EKG None  Radiology No results found.  Procedures Procedures   Medications Ordered in ED Medications  sodium chloride 0.9 % bolus 500 mL (0 mLs Intravenous Stopped 07/30/20 2121)  oxyCODONE (Oxy IR/ROXICODONE) immediate release tablet 5 mg (5 mg Oral Given 07/30/20 2119)  sodium chloride 0.9 % bolus 500 mL (0 mLs Intravenous Stopped 07/30/20 2150)    ED Course  I have reviewed the triage vital signs and the nursing notes.  Pertinent labs & imaging results that were available during my care of the patient were reviewed by me and considered in my medical decision making (see chart for details).    MDM Rules/Calculators/A&P                         patient with history of CKD, kidney stones here for evaluation of recurrent flank pain. His pain is resolved after receiving Toradol prior to ED presentation. Reviewed records in care everywhere. He had recent creatinine of 1.6 on April 3. He was started on Cipro for bacteria present in his urine. UA today is not consistent with UTI. Patient with creatinine of two with potassium to 5.2 on today's labs. Discussed with Dr. Alvester Morin with urology. Patient's pain is controlled and he has appointment  scheduled for tomorrow. Plan to discharge home with close urology follow-up. Return precautions discussed. Will hold his metformin as well as lisinopril tonight and tomorrow. Discussed with patient importance of having his renal function rechecked.  Final Clinical Impression(s) / ED Diagnoses Final diagnoses:  AKI (acute kidney injury) (HCC)  Renal colic on right side    Rx / DC Orders ED Discharge Orders         Ordered    oxyCODONE (ROXICODONE) 5 MG immediate release tablet  Every 4 hours PRN        07/30/20 2215           Tilden Fossa, MD 07/30/20 2217

## 2020-07-30 NOTE — Discharge Instructions (Addendum)
Do not take your metformin tonight or tomorrow morning.  Do not take your blood pressure medication in the morning.  Do not eat after midnight.    Drink plenty of fluids.    Please have your kidney function recheck to the next week.  Get rechecked immediately if you develop fevers, uncontrolled pain or cannot urinate.

## 2020-07-31 DIAGNOSIS — N201 Calculus of ureter: Secondary | ICD-10-CM | POA: Diagnosis not present

## 2020-08-01 LAB — URINE CULTURE: Culture: NO GROWTH

## 2020-08-12 DIAGNOSIS — N201 Calculus of ureter: Secondary | ICD-10-CM | POA: Diagnosis not present

## 2020-08-12 DIAGNOSIS — N281 Cyst of kidney, acquired: Secondary | ICD-10-CM | POA: Diagnosis not present

## 2020-08-27 DIAGNOSIS — N202 Calculus of kidney with calculus of ureter: Secondary | ICD-10-CM | POA: Diagnosis not present

## 2020-08-27 DIAGNOSIS — N281 Cyst of kidney, acquired: Secondary | ICD-10-CM | POA: Diagnosis not present

## 2020-09-26 DIAGNOSIS — G4733 Obstructive sleep apnea (adult) (pediatric): Secondary | ICD-10-CM | POA: Diagnosis not present

## 2020-10-08 DIAGNOSIS — E1122 Type 2 diabetes mellitus with diabetic chronic kidney disease: Secondary | ICD-10-CM | POA: Diagnosis not present

## 2020-10-08 DIAGNOSIS — E7849 Other hyperlipidemia: Secondary | ICD-10-CM | POA: Diagnosis not present

## 2020-10-08 DIAGNOSIS — E782 Mixed hyperlipidemia: Secondary | ICD-10-CM | POA: Diagnosis not present

## 2020-10-08 DIAGNOSIS — N183 Chronic kidney disease, stage 3 unspecified: Secondary | ICD-10-CM | POA: Diagnosis not present

## 2020-10-08 DIAGNOSIS — E1165 Type 2 diabetes mellitus with hyperglycemia: Secondary | ICD-10-CM | POA: Diagnosis not present

## 2020-10-08 DIAGNOSIS — K2100 Gastro-esophageal reflux disease with esophagitis, without bleeding: Secondary | ICD-10-CM | POA: Diagnosis not present

## 2020-10-08 DIAGNOSIS — E039 Hypothyroidism, unspecified: Secondary | ICD-10-CM | POA: Diagnosis not present

## 2020-10-10 DIAGNOSIS — F3342 Major depressive disorder, recurrent, in full remission: Secondary | ICD-10-CM | POA: Diagnosis not present

## 2020-10-10 DIAGNOSIS — G4733 Obstructive sleep apnea (adult) (pediatric): Secondary | ICD-10-CM | POA: Diagnosis not present

## 2020-10-10 DIAGNOSIS — Z7189 Other specified counseling: Secondary | ICD-10-CM | POA: Diagnosis not present

## 2020-10-10 DIAGNOSIS — I10 Essential (primary) hypertension: Secondary | ICD-10-CM | POA: Diagnosis not present

## 2020-10-10 DIAGNOSIS — E1165 Type 2 diabetes mellitus with hyperglycemia: Secondary | ICD-10-CM | POA: Diagnosis not present

## 2020-10-10 DIAGNOSIS — E7849 Other hyperlipidemia: Secondary | ICD-10-CM | POA: Diagnosis not present

## 2020-10-10 DIAGNOSIS — E1122 Type 2 diabetes mellitus with diabetic chronic kidney disease: Secondary | ICD-10-CM | POA: Diagnosis not present

## 2020-10-10 DIAGNOSIS — J449 Chronic obstructive pulmonary disease, unspecified: Secondary | ICD-10-CM | POA: Diagnosis not present

## 2020-11-12 DIAGNOSIS — Z6832 Body mass index (BMI) 32.0-32.9, adult: Secondary | ICD-10-CM | POA: Diagnosis not present

## 2020-11-12 DIAGNOSIS — L723 Sebaceous cyst: Secondary | ICD-10-CM | POA: Diagnosis not present

## 2020-11-12 DIAGNOSIS — M79646 Pain in unspecified finger(s): Secondary | ICD-10-CM | POA: Diagnosis not present

## 2020-11-28 DIAGNOSIS — M79645 Pain in left finger(s): Secondary | ICD-10-CM | POA: Diagnosis not present

## 2020-11-28 DIAGNOSIS — M7989 Other specified soft tissue disorders: Secondary | ICD-10-CM | POA: Diagnosis not present

## 2020-12-18 DIAGNOSIS — Z961 Presence of intraocular lens: Secondary | ICD-10-CM | POA: Diagnosis not present

## 2020-12-18 DIAGNOSIS — H04123 Dry eye syndrome of bilateral lacrimal glands: Secondary | ICD-10-CM | POA: Diagnosis not present

## 2020-12-18 DIAGNOSIS — E119 Type 2 diabetes mellitus without complications: Secondary | ICD-10-CM | POA: Diagnosis not present

## 2020-12-18 DIAGNOSIS — H35362 Drusen (degenerative) of macula, left eye: Secondary | ICD-10-CM | POA: Diagnosis not present

## 2020-12-18 DIAGNOSIS — H35033 Hypertensive retinopathy, bilateral: Secondary | ICD-10-CM | POA: Diagnosis not present

## 2020-12-25 ENCOUNTER — Other Ambulatory Visit: Payer: Self-pay

## 2020-12-25 DIAGNOSIS — M7989 Other specified soft tissue disorders: Secondary | ICD-10-CM | POA: Diagnosis not present

## 2020-12-25 DIAGNOSIS — R2232 Localized swelling, mass and lump, left upper limb: Secondary | ICD-10-CM | POA: Diagnosis not present

## 2021-01-09 DIAGNOSIS — G4733 Obstructive sleep apnea (adult) (pediatric): Secondary | ICD-10-CM | POA: Diagnosis not present

## 2021-02-06 DIAGNOSIS — K2100 Gastro-esophageal reflux disease with esophagitis, without bleeding: Secondary | ICD-10-CM | POA: Diagnosis not present

## 2021-02-06 DIAGNOSIS — E1122 Type 2 diabetes mellitus with diabetic chronic kidney disease: Secondary | ICD-10-CM | POA: Diagnosis not present

## 2021-02-06 DIAGNOSIS — E7849 Other hyperlipidemia: Secondary | ICD-10-CM | POA: Diagnosis not present

## 2021-02-06 DIAGNOSIS — N183 Chronic kidney disease, stage 3 unspecified: Secondary | ICD-10-CM | POA: Diagnosis not present

## 2021-02-06 DIAGNOSIS — E7801 Familial hypercholesterolemia: Secondary | ICD-10-CM | POA: Diagnosis not present

## 2021-02-06 DIAGNOSIS — E039 Hypothyroidism, unspecified: Secondary | ICD-10-CM | POA: Diagnosis not present

## 2021-02-06 DIAGNOSIS — I10 Essential (primary) hypertension: Secondary | ICD-10-CM | POA: Diagnosis not present

## 2021-02-06 DIAGNOSIS — E7800 Pure hypercholesterolemia, unspecified: Secondary | ICD-10-CM | POA: Diagnosis not present

## 2021-02-06 DIAGNOSIS — E1165 Type 2 diabetes mellitus with hyperglycemia: Secondary | ICD-10-CM | POA: Diagnosis not present

## 2021-02-06 DIAGNOSIS — E782 Mixed hyperlipidemia: Secondary | ICD-10-CM | POA: Diagnosis not present

## 2021-02-19 DIAGNOSIS — J449 Chronic obstructive pulmonary disease, unspecified: Secondary | ICD-10-CM | POA: Diagnosis not present

## 2021-02-19 DIAGNOSIS — E7849 Other hyperlipidemia: Secondary | ICD-10-CM | POA: Diagnosis not present

## 2021-02-19 DIAGNOSIS — F3342 Major depressive disorder, recurrent, in full remission: Secondary | ICD-10-CM | POA: Diagnosis not present

## 2021-02-19 DIAGNOSIS — E1165 Type 2 diabetes mellitus with hyperglycemia: Secondary | ICD-10-CM | POA: Diagnosis not present

## 2021-02-19 DIAGNOSIS — I10 Essential (primary) hypertension: Secondary | ICD-10-CM | POA: Diagnosis not present

## 2021-02-19 DIAGNOSIS — E1122 Type 2 diabetes mellitus with diabetic chronic kidney disease: Secondary | ICD-10-CM | POA: Diagnosis not present

## 2021-02-19 DIAGNOSIS — Z23 Encounter for immunization: Secondary | ICD-10-CM | POA: Diagnosis not present

## 2021-02-19 DIAGNOSIS — Z7189 Other specified counseling: Secondary | ICD-10-CM | POA: Diagnosis not present

## 2021-03-03 DIAGNOSIS — M47816 Spondylosis without myelopathy or radiculopathy, lumbar region: Secondary | ICD-10-CM | POA: Diagnosis not present

## 2021-03-03 DIAGNOSIS — S336XXA Sprain of sacroiliac joint, initial encounter: Secondary | ICD-10-CM | POA: Diagnosis not present

## 2021-03-03 DIAGNOSIS — M9903 Segmental and somatic dysfunction of lumbar region: Secondary | ICD-10-CM | POA: Diagnosis not present

## 2021-03-04 DIAGNOSIS — S336XXA Sprain of sacroiliac joint, initial encounter: Secondary | ICD-10-CM | POA: Diagnosis not present

## 2021-03-04 DIAGNOSIS — M9903 Segmental and somatic dysfunction of lumbar region: Secondary | ICD-10-CM | POA: Diagnosis not present

## 2021-03-04 DIAGNOSIS — M47816 Spondylosis without myelopathy or radiculopathy, lumbar region: Secondary | ICD-10-CM | POA: Diagnosis not present

## 2021-03-06 DIAGNOSIS — S336XXA Sprain of sacroiliac joint, initial encounter: Secondary | ICD-10-CM | POA: Diagnosis not present

## 2021-03-06 DIAGNOSIS — M9903 Segmental and somatic dysfunction of lumbar region: Secondary | ICD-10-CM | POA: Diagnosis not present

## 2021-03-06 DIAGNOSIS — M47816 Spondylosis without myelopathy or radiculopathy, lumbar region: Secondary | ICD-10-CM | POA: Diagnosis not present

## 2021-03-07 DIAGNOSIS — M47816 Spondylosis without myelopathy or radiculopathy, lumbar region: Secondary | ICD-10-CM | POA: Diagnosis not present

## 2021-03-07 DIAGNOSIS — M9903 Segmental and somatic dysfunction of lumbar region: Secondary | ICD-10-CM | POA: Diagnosis not present

## 2021-03-07 DIAGNOSIS — S336XXA Sprain of sacroiliac joint, initial encounter: Secondary | ICD-10-CM | POA: Diagnosis not present

## 2021-03-10 DIAGNOSIS — M47816 Spondylosis without myelopathy or radiculopathy, lumbar region: Secondary | ICD-10-CM | POA: Diagnosis not present

## 2021-03-10 DIAGNOSIS — M9903 Segmental and somatic dysfunction of lumbar region: Secondary | ICD-10-CM | POA: Diagnosis not present

## 2021-03-10 DIAGNOSIS — S336XXA Sprain of sacroiliac joint, initial encounter: Secondary | ICD-10-CM | POA: Diagnosis not present

## 2021-03-13 DIAGNOSIS — M47816 Spondylosis without myelopathy or radiculopathy, lumbar region: Secondary | ICD-10-CM | POA: Diagnosis not present

## 2021-03-13 DIAGNOSIS — M9903 Segmental and somatic dysfunction of lumbar region: Secondary | ICD-10-CM | POA: Diagnosis not present

## 2021-03-13 DIAGNOSIS — S336XXA Sprain of sacroiliac joint, initial encounter: Secondary | ICD-10-CM | POA: Diagnosis not present

## 2021-03-17 DIAGNOSIS — M5451 Vertebrogenic low back pain: Secondary | ICD-10-CM | POA: Diagnosis not present

## 2021-06-17 DIAGNOSIS — E039 Hypothyroidism, unspecified: Secondary | ICD-10-CM | POA: Diagnosis not present

## 2021-06-17 DIAGNOSIS — E7849 Other hyperlipidemia: Secondary | ICD-10-CM | POA: Diagnosis not present

## 2021-06-17 DIAGNOSIS — E7800 Pure hypercholesterolemia, unspecified: Secondary | ICD-10-CM | POA: Diagnosis not present

## 2021-06-17 DIAGNOSIS — I10 Essential (primary) hypertension: Secondary | ICD-10-CM | POA: Diagnosis not present

## 2021-06-17 DIAGNOSIS — E7801 Familial hypercholesterolemia: Secondary | ICD-10-CM | POA: Diagnosis not present

## 2021-06-17 DIAGNOSIS — E782 Mixed hyperlipidemia: Secondary | ICD-10-CM | POA: Diagnosis not present

## 2021-06-17 DIAGNOSIS — K2100 Gastro-esophageal reflux disease with esophagitis, without bleeding: Secondary | ICD-10-CM | POA: Diagnosis not present

## 2021-06-17 DIAGNOSIS — E1122 Type 2 diabetes mellitus with diabetic chronic kidney disease: Secondary | ICD-10-CM | POA: Diagnosis not present

## 2021-06-17 DIAGNOSIS — E1165 Type 2 diabetes mellitus with hyperglycemia: Secondary | ICD-10-CM | POA: Diagnosis not present

## 2021-06-17 DIAGNOSIS — N183 Chronic kidney disease, stage 3 unspecified: Secondary | ICD-10-CM | POA: Diagnosis not present

## 2021-06-25 DIAGNOSIS — E1165 Type 2 diabetes mellitus with hyperglycemia: Secondary | ICD-10-CM | POA: Diagnosis not present

## 2021-06-25 DIAGNOSIS — E1122 Type 2 diabetes mellitus with diabetic chronic kidney disease: Secondary | ICD-10-CM | POA: Diagnosis not present

## 2021-06-25 DIAGNOSIS — I700 Atherosclerosis of aorta: Secondary | ICD-10-CM | POA: Diagnosis not present

## 2021-06-25 DIAGNOSIS — J449 Chronic obstructive pulmonary disease, unspecified: Secondary | ICD-10-CM | POA: Diagnosis not present

## 2021-06-25 DIAGNOSIS — E7849 Other hyperlipidemia: Secondary | ICD-10-CM | POA: Diagnosis not present

## 2021-06-25 DIAGNOSIS — I10 Essential (primary) hypertension: Secondary | ICD-10-CM | POA: Diagnosis not present

## 2021-06-25 DIAGNOSIS — F3342 Major depressive disorder, recurrent, in full remission: Secondary | ICD-10-CM | POA: Diagnosis not present

## 2021-06-25 DIAGNOSIS — Z6833 Body mass index (BMI) 33.0-33.9, adult: Secondary | ICD-10-CM | POA: Diagnosis not present

## 2021-08-02 DIAGNOSIS — I10 Essential (primary) hypertension: Secondary | ICD-10-CM | POA: Diagnosis not present

## 2021-08-02 DIAGNOSIS — J441 Chronic obstructive pulmonary disease with (acute) exacerbation: Secondary | ICD-10-CM | POA: Diagnosis not present

## 2021-08-02 DIAGNOSIS — Z6833 Body mass index (BMI) 33.0-33.9, adult: Secondary | ICD-10-CM | POA: Diagnosis not present

## 2021-08-02 DIAGNOSIS — Z20828 Contact with and (suspected) exposure to other viral communicable diseases: Secondary | ICD-10-CM | POA: Diagnosis not present

## 2021-08-26 ENCOUNTER — Encounter: Payer: Self-pay | Admitting: Internal Medicine

## 2021-08-26 ENCOUNTER — Ambulatory Visit: Payer: Medicare PPO | Admitting: Internal Medicine

## 2021-08-26 ENCOUNTER — Telehealth: Payer: Self-pay | Admitting: Internal Medicine

## 2021-08-26 ENCOUNTER — Ambulatory Visit (INDEPENDENT_AMBULATORY_CARE_PROVIDER_SITE_OTHER): Payer: Medicare PPO

## 2021-08-26 DIAGNOSIS — I10 Essential (primary) hypertension: Secondary | ICD-10-CM | POA: Diagnosis not present

## 2021-08-26 DIAGNOSIS — R058 Other specified cough: Secondary | ICD-10-CM

## 2021-08-26 DIAGNOSIS — J42 Unspecified chronic bronchitis: Secondary | ICD-10-CM | POA: Diagnosis not present

## 2021-08-26 DIAGNOSIS — R059 Cough, unspecified: Secondary | ICD-10-CM | POA: Diagnosis not present

## 2021-08-26 DIAGNOSIS — J9811 Atelectasis: Secondary | ICD-10-CM | POA: Diagnosis not present

## 2021-08-26 MED ORDER — AMOXICILLIN-POT CLAVULANATE 875-125 MG PO TABS: 1.0000 | ORAL_TABLET | Freq: Two times a day (BID) | ORAL | 0 refills | Status: AC

## 2021-08-26 MED ORDER — AMLODIPINE BESYLATE-VALSARTAN 5-160 MG PO TABS: 1.0000 | ORAL_TABLET | Freq: Every day | ORAL | 11 refills | Status: AC

## 2021-08-26 NOTE — Progress Notes (Signed)
? ?Mathew Bernard, male    DOB: 1951/03/07    MRN: [id number] ? ? ?Brief patient profile:  ? 75 yowm Quit smoking 11/14/12 with baseline then = no doe but recurrent problems with uri's/ "bad bronchitis" sev a year   referred to pulmonary clinic 08/26/2021 by Dr Neita Carp for refractory cough/wheezing since uri end of March 2023. ? ? ? ?History of Present Illness  ?08/26/2021  Pulmonary/ 1st office eval/Kennetha Pearman on ACEi  ?Chief Complaint  ?Patient presents with  ? Consult  ?  Cough, congestion and wheezing for past 5 weeks.  Creamy to brown.  Saw PCP and got antibiotic and prednisone on April 8th.  Little improvement.  A lot of coughing/wheezing at HS.  SOB with exertion.  Gets fatigued after exerting himself.  ?Dyspnea:  only when picks up heavy stuff - not with use of legs anywhere [place]  ?Cough: 1st thing am min brown / worse when reclining p outside > sensation of choking  ?Sleep: has to take nyquil then does fine flat ?SABA use: stiolto can't say it helps/ nor albuterol  ?On ppi qod prn  ? ? ?No obvious day to day or daytime variability or assoc  mucus plugs or hemoptysis or cp or chest tightness,   or overt sinus or hb symptoms.  ? ?Sleeping  without nocturnal  or early am exacerbation  of respiratory  c/o's or need for noct saba. Also denies any obvious fluctuation of symptoms with weather or environmental changes or other aggravating or alleviating factors except as outlined above  ? ?No unusual exposure hx or h/o childhood pna/ asthma or knowledge of premature birth. ? ?Current Allergies, Complete Past Medical History, Past Surgical History, Family History, and Social History were reviewed in [employer] record. ? ?ROS  The following are not active complaints unless bolded ?Hoarseness, sore throat/globus, dysphagia, dental problems, itching, sneezing,  nasal congestion or discharge of excess mucus or purulent secretions, ear ache,   fever, chills, sweats, unintended wt loss or wt  gain, classically pleuritic or exertional cp,  orthopnea pnd or arm/hand swelling  or leg swelling, presyncope, palpitations, abdominal pain, anorexia, nausea, vomiting, diarrhea  or change in bowel habits or change in bladder habits, change in stools or change in urine, dysuria, hematuria,  rash, arthralgias, visual complaints, headache, numbness, weakness or ataxia or problems with walking or coordination,  change in mood or  memory. ?      ?   ? ?Past Medical History:  ?Diagnosis Date  ? Anxiety   ? Arthritis   ? Complication of anesthesia   ?  "depressed after surgery"  ? Depression   ? Diabetes mellitus without complication (HCC)   ? GERD (gastroesophageal reflux disease)   ? High cholesterol   ? History of kidney stones   ? Hypertension   ? Hypothyroidism   ? Pneumonia   ? hx of in 70's  ? Sleep apnea   ? CPAP  ? Stroke (HCC) 11/14/2012  ?  right basal ganglia infarct  ? Thyroid disease   ? ? ?Outpatient Medications Prior to Visit  ?Medication Sig Dispense Refill  ? albuterol (PROVENTIL HFA;VENTOLIN HFA) 108 (90 BASE) MCG/ACT inhaler Inhale 2 puffs into the lungs every 6 (six) hours as needed for wheezing.    ? amLODipine-benazepril (LOTREL) 5-10 MG per capsule Take 1 capsule by mouth daily.    ? aspirin EC 81 MG tablet Take 1 tablet (81 mg total) by  mouth daily.    ? cetirizine (ZYRTEC) 10 MG tablet Take 10 mg by mouth daily as needed for allergies.     ? co-enzyme Q-10 50 MG capsule Take 50 mg by mouth daily.    ? fluticasone (FLONASE) 50 MCG/ACT nasal spray Place 2 sprays into the nose as needed for rhinitis.    ? levothyroxine (SYNTHROID, LEVOTHROID) 50 MCG tablet Take 50 mcg by mouth daily before breakfast.    ? metFORMIN (GLUCOPHAGE-XR) 500 MG 24 hr tablet Take 500 mg by mouth 2 (two) times daily.    ? Multiple Vitamins-Minerals (CENTRUM ADULTS PO) Take 1 tablet by mouth daily at 6 (six) AM.    ? Omega-3 Fatty Acids (FISH OIL) 1200 MG CAPS Take 2,400 mg by mouth 2 (two) times daily.    ? omeprazole  (PRILOSEC) 20 MG capsule Take 20 mg by mouth 2 (two) times daily.    ? sertraline (ZOLOFT) 100 MG tablet Take 150 mg by mouth at bedtime.    ? simvastatin (ZOCOR) 40 MG tablet Take 40 mg by mouth at bedtime.    ? STIOLTO RESPIMAT 2.5-2.5 MCG/ACT AERS Inhale 2 puffs into the lungs daily.    ? oxyCODONE (ROXICODONE) 5 MG immediate release tablet Take 1 tablet (5 mg total) by mouth every 4 (four) hours as needed for severe pain. (Patient not taking: Reported on 08/26/2021) 15 tablet 0  ? vitamin B-12 (CYANOCOBALAMIN) 1000 MCG tablet Take 1,000 mcg by mouth daily. (Patient not taking: Reported on 08/26/2021)    ? ?No facility-administered medications prior to visit.  ? ? ? ?Objective:  ?  ? ?BP 130/70 (BP Location: Left Arm, Patient Position: Sitting, Cuff Size: Large)   Pulse 75   Temp 98.4 ?F (36.9 ?C) (Oral)   Ht 5\' 9"  (1.753 m)   Wt 226 lb 9.6 oz (102.8 kg)   SpO2 96%   BMI 33.46 kg/m?  ? ?SpO2: 96 % ? ?Amb wm harsh upper airway cough ? ?HEENT : oropharynx clear, nasal turbinates nl   ? ?NECK :  without JVD/Nodes/TM/ nl carotid upstrokes bilaterally ? ? ?LUNGS: no acc muscle use,  Min barrel  contour chest wall with bilateral  slightly decreased bs s audible wheeze and  without cough on insp or exp maneuvers and min  Hyperresonant  to  percussion bilaterally   ? ? ?CV:  RRR  no s3 or murmur or increase in P2, and no edema  ? ?ABD:  soft and nontender with pos end  insp Hoover's  in the supine position. No bruits or organomegaly appreciated, bowel sounds nl ? ?MS:   Nl gait/  ext warm without deformities, calf tenderness, cyanosis or clubbing ?No obvious joint restrictions  ? ?SKIN: warm and dry without lesions   ? ?NEURO:  alert, approp, nl sensorium with  no motor or cerebellar deficits apparent.  ? ? ? CXR PA and Lateral:   08/26/2021 :    ?I personally reviewed images and impression is as follows:     ?C/w post covid mild scarring  ? ? ?   ?Assessment  ? ?Chronic bronchitis (HCC) ?Stopped smoking 2014  > only  vaping since  ?- d/c ACEi 08/26/2021  ? ? ?When respiratory symptoms begin or become refractory well after a patient reports complete smoking cessation,  Especially when this wasn't the case while they were smoking, a red flag is raised based on the work of Dr 10/26/2021 which states: ? if you quit smoking when your best  day FEV1 is still well preserved it is highly unlikely you will progress to severe disease. ? ?That is to say, once the smoking stops,  the symptoms should not suddenly erupt or markedly worsen.  If so, the differential diagnosis should include  obesity/deconditioning,  LPR/Reflux/Aspiration syndromes,  occult CHF, or  especially side effect of medications commonly used in this population, esp acei.  ? ?rec rx gerd/? Occult sinus dz and trial off ACEi x 6 weeks then ov with pfts w/a ? ?Upper airway cough syndrome ?Upper airway cough syndrome (previously labeled PNDS),  is so named because it's frequently impossible to sort out how much is  CR/sinusitis with freq throat clearing (which can be related to primary GERD)   vs  causing  secondary (" extra esophageal")  GERD from wide swings in gastric pressure that occur with throat clearing, often  promoting self use of mint and menthol lozenges that reduce the lower esophageal sphincter tone and exacerbate the problem further in a cyclical fashion.  ? ?These are the same pts (now being labeled as having "irritable larynx syndrome" by some cough centers) who not infrequently have a history of having failed to tolerate ace inhibitors,  dry powder inhalers or biphosphonates or report having atypical/extraesophageal reflux symptoms that don't respond to standard doses of PPI  and are easily confused as having aecopd or asthma flares by even experienced allergists/ pulmonologists (myself included).  ? ?rec trial off acei/ max gerd rx initial rx and f/u in 6 weeks ? ?Essential hypertension, benign ?In the best review of chronic cough to date ( NEJM 2016 375  [phone]) ,  ACEi are now felt to cause cough in up to  20% of pts which is a 4 fold increase from previous reports and does not include the variety of non-specific complaints we see in pulmonary clinic in pts on ACEi

## 2021-08-26 NOTE — Assessment & Plan Note (Signed)
Upper airway cough syndrome (previously labeled PNDS),  is so named because it's frequently impossible to sort out how much is  CR/sinusitis with freq throat clearing (which can be related to primary GERD)   vs  causing  secondary (" extra esophageal")  GERD from wide swings in gastric pressure that occur with throat clearing, often  promoting self use of mint and menthol lozenges that reduce the lower esophageal sphincter tone and exacerbate the problem further in a cyclical fashion.  ? ?These are the same pts (now being labeled as having "irritable larynx syndrome" by some cough centers) who not infrequently have a history of having failed to tolerate ace inhibitors,  dry powder inhalers or biphosphonates or report having atypical/extraesophageal reflux symptoms that don't respond to standard doses of PPI  and are easily confused as having aecopd or asthma flares by even experienced allergists/ pulmonologists (myself included).  ? ?rec trial off acei/ max gerd rx initial rx and f/u in 6 weeks ?

## 2021-08-26 NOTE — Patient Instructions (Addendum)
Stop fish oil until no coughing  ? ?Start  amlodipine-valsartan in place lotrel  ? ?Prilosec  should  Take 30- 60 min before your first and last meals of the day until cough is gone completely for a week ? ?GERD (REFLUX)  is an extremely common cause of respiratory symptoms just like yours , many times with no obvious heartburn at all.  ? ? It can be treated with medication, but also with lifestyle changes including elevation of the head of your bed (ideally with 6 -8inch blocks under the headboard of your bed),  Smoking cessation, avoidance of late meals, excessive alcohol, and avoid fatty foods, chocolate, peppermint, colas, red wine, and acidic juices such as orange juice.  ?NO MINT OR MENTHOL PRODUCTS SO NO COUGH DROPS  ?USE SUGARLESS CANDY INSTEAD (Jolley ranchers or Stover's or Life Savers) or even ice chips will also do - the key is to swallow to prevent all throat clearing. ?NO OIL BASED VITAMINS - use powdered substitutes.  Avoid fish oil when coughing.  ? ?Augmentin 875 mg take one pill twice daily  X 10 days - take at breakfast and supper with large glass of water.  It would help reduce the usual side effects (diarrhea and yeast infections) if you ate cultured yogurt at lunch.  ?  ? ?Set up for next available PFTs in [place]. ? ?Please remember to go to the  x-ray department  for your tests - we will call you with the results when they are available  ? ?Please schedule a follow up office visit in 6 weeks, call sooner if needed  office   ? ? ?

## 2021-08-26 NOTE — Assessment & Plan Note (Addendum)
In the best review of chronic cough to date ( NEJM 2016 375 [phone]) ,  ACEi are now felt to cause cough in up to  20% of pts which is a 4 fold increase from previous reports and does not include the variety of non-specific complaints we see in pulmonary clinic in pts on ACEi but previously attributed to another dx like  Copd/asthma and  include PNDS, throat congestion/globus, "bronchitis", unexplained dyspnea and  Choking sensations in recliner, and hoarseness, but also atypical /refractory GERD symptoms like dysphagia and "bad heartburn"  ? ?The only way I know  to prove this is not an "ACEi Case" is a trial off ACEi x a minimum of 6 weeks then regroup.  ? ?Try  Amlodipine-valsartan  5-160 one daily and f/u in 6 weeks in the [place] office ? ? ?    ?  ? ?Each maintenance medication was reviewed in detail including emphasizing most importantly the difference between maintenance and prns and under what circumstances the prns are to be triggered using an action plan format where appropriate. ? ?Total time for H and P, chart review, counseling,  and generating customized AVS unique to this office visit / same day charting = 60 min new pt eval  ?     ? ?

## 2021-08-26 NOTE — Telephone Encounter (Signed)
PFTs are not performed in [place] office only at [facility]. Will place order for patient to have PFT done at [facility].  ?

## 2021-08-26 NOTE — Assessment & Plan Note (Signed)
Stopped smoking 2014  > only vaping since  ?- d/c ACEi 08/26/2021  ? ? ?When respiratory symptoms begin or become refractory well after a patient reports complete smoking cessation,  Especially when this wasn't the case while they were smoking, a red flag is raised based on the work of Dr Kris Mouton which states: ? if you quit smoking when your best day FEV1 is still well preserved it is highly unlikely you will progress to severe disease. ? ?That is to say, once the smoking stops,  the symptoms should not suddenly erupt or markedly worsen.  If so, the differential diagnosis should include  obesity/deconditioning,  LPR/Reflux/Aspiration syndromes,  occult CHF, or  especially side effect of medications commonly used in this population, esp acei.  ? ?rec rx gerd/? Occult sinus dz and trial off ACEi x 6 weeks then ov with pfts w/a ?

## 2021-10-13 ENCOUNTER — Encounter: Payer: Self-pay | Admitting: Internal Medicine

## 2021-10-13 ENCOUNTER — Ambulatory Visit (INDEPENDENT_AMBULATORY_CARE_PROVIDER_SITE_OTHER): Payer: Medicare PPO | Admitting: Internal Medicine

## 2021-10-13 DIAGNOSIS — R058 Other specified cough: Secondary | ICD-10-CM

## 2021-10-13 DIAGNOSIS — I10 Essential (primary) hypertension: Secondary | ICD-10-CM | POA: Diagnosis not present

## 2021-10-13 DIAGNOSIS — Z87891 Personal history of nicotine dependence: Secondary | ICD-10-CM | POA: Diagnosis not present

## 2021-10-13 NOTE — Assessment & Plan Note (Signed)
Trial off acei 08/26/2021 >>> resolved cough p one month  Although even in retrospect it may not be clear the ACEi contributed to the pt's symptoms,  Pt improved off them and adding them back at this point or in the future would risk confusion in interpretation of non-specific respiratory symptoms to which this patient is prone  ie  Better not to muddy the waters here.   >>> f/u pcp planned

## 2021-10-13 NOTE — Assessment & Plan Note (Signed)
Resolved off ACEi 10/13/2021   Ok to f/u with PCP and here prn flare with URI's if last longer than 2 weeks          Each maintenance medication was reviewed in detail including emphasizing most importantly the difference between maintenance and prns and under what circumstances the prns are to be triggered using an action plan format where appropriate.  Total time for H and P, chart review, counseling  and generating customized AVS unique to this office visit / same day charting for final f/u sumery visit x 25 min

## 2021-10-13 NOTE — Progress Notes (Signed)
Mathew Bernard, male    DOB: 08-02-1950    MRN: [id number]   Brief patient profile:   41 yowm Quit smoking 11/14/12 with baseline then = no doe but recurrent problems with uri's/ "bad bronchitis" sev a year   referred to pulmonary clinic 08/26/2021 by Dr Neita Carp for refractory cough/wheezing since uri end of March 2023.    History of Present Illness  08/26/2021  Pulmonary/ 1st office eval/Nicle Connole on ACEi  Chief Complaint  Patient presents with   Consult    Cough, congestion and wheezing for past 5 weeks.  Creamy to brown.  Saw PCP and got antibiotic and prednisone on April 8th.  Little improvement.  A lot of coughing/wheezing at HS.  SOB with exertion.  Gets fatigued after exerting himself.  Dyspnea:  only when picks up heavy stuff - not with use of legs anywhere [place] go [place]  Cough: 1st thing am min brown / worse when reclining p outside > sensation of choking  Sleep: has to take nyquil then does fine flat SABA use: stiolto can't say it helps/ nor albuterol  On ppi qod prn  Rec Stop fish oil until no coughing  Start  amlodipine-valsartan in place lotrel  Prilosec  should  Take 30- 60 min before your first and last meals of the day until cough is gone completely for a week GERD Augmentin 875 mg take one pill twice daily  X 10 days   Set up for next available PFTs in [place].    10/13/2021  f/u ov/[place] office/Dulce Martian re: UACS / ? Acei case  maint off acei and no cough  Chief Complaint  Patient presents with   Follow-up    Cough is gone as of a month ago   Dyspnea:    Not limited by breathing from desired activities   Cough: resolved  Sleeping: no more choking spells /able to lie flat  SABA use: none  02: none  Lung cancer screening: recommended   No obvious day to day or daytime variability or assoc excess/ purulent sputum or mucus plugs or hemoptysis or cp or chest tightness, subjective wheeze or overt sinus or hb symptoms.   sleeping without nocturnal  or early  am exacerbation  of respiratory  c/o's or need for noct saba. Also denies any obvious fluctuation of symptoms with weather or environmental changes or other aggravating or alleviating factors except as outlined above   No unusual exposure hx or h/o childhood pna/ asthma or knowledge of premature birth.  Current Allergies, Complete Past Medical History, Past Surgical History, Family History, and Social History were reviewed in [employer] record.  ROS  The following are not active complaints unless bolded Hoarseness, sore throat, dysphagia, dental problems, itching, sneezing,  nasal congestion or discharge of excess mucus or purulent secretions, ear ache,   fever, chills, sweats, unintended wt loss or wt gain, classically pleuritic or exertional cp,  orthopnea pnd or arm/hand swelling  or leg swelling, presyncope, palpitations, abdominal pain, anorexia, nausea, vomiting, diarrhea  or change in bowel habits or change in bladder habits, change in stools or change in urine, dysuria, hematuria,  rash, arthralgias, visual complaints, headache, numbness, weakness or ataxia or problems with walking or coordination,  change in mood or  memory.        Current Meds  Medication Sig   albuterol (PROVENTIL HFA;VENTOLIN HFA) 108 (90 BASE) MCG/ACT inhaler Inhale 2 puffs into the lungs every 6 (six) hours as  needed for wheezing.   amLODipine-valsartan (EXFORGE) 5-160 MG tablet Take 1 tablet by mouth daily.   aspirin EC 81 MG tablet Take 1 tablet (81 mg total) by mouth daily.   cetirizine (ZYRTEC) 10 MG tablet Take 10 mg by mouth daily as needed for allergies.    co-enzyme Q-10 50 MG capsule Take 50 mg by mouth daily.   fluticasone (FLONASE) 50 MCG/ACT nasal spray Place 2 sprays into the nose as needed for rhinitis.   levothyroxine (SYNTHROID, LEVOTHROID) 50 MCG tablet Take 50 mcg by mouth daily before breakfast.   metFORMIN (GLUCOPHAGE-XR) 500 MG 24 hr tablet Take 500 mg by mouth 2 (two)  times daily.   Multiple Vitamins-Minerals (CENTRUM ADULTS PO) Take 1 tablet by mouth daily at 6 (six) AM.   omeprazole (PRILOSEC) 20 MG capsule Take 20 mg by mouth 2 (two) times daily.   sertraline (ZOLOFT) 100 MG tablet Take 150 mg by mouth at bedtime.   simvastatin (ZOCOR) 40 MG tablet Take 40 mg by mouth at bedtime.                    Past Medical History:  Diagnosis Date   Anxiety    Arthritis    Complication of anesthesia     "depressed after surgery"   Depression    Diabetes mellitus without complication (HCC)    GERD (gastroesophageal reflux disease)    High cholesterol    History of kidney stones    Hypertension    Hypothyroidism    Pneumonia    hx of in 70's   Sleep apnea    CPAP   Stroke (HCC) 11/14/2012    right basal ganglia infarct   Thyroid disease         Objective:      10/13/2021       227    08/26/21 226 lb 9.6 oz (102.8 kg)  07/30/20 212 lb (96.2 kg)  06/19/20 215 lb (97.5 kg)      Vital signs reviewed  10/13/2021  - Note at rest 02 sats  97% on RA   General appearance:    amb mod obese wm and     HEENT : Oropharynx  clear  Nasal turbinates nl    NECK :  without  apparent JVD/ palpable Nodes/TM    LUNGS: no acc muscle use,  Min barrel  contour chest wall with bilateral  slightly decreased bs s audible wheeze and  without cough on insp or exp maneuvers and min  Hyperresonant  to  percussion bilaterally    CV:  RRR  no s3 or murmur or increase in P2, and no edema   ABD:  soft and nontender with pos end  insp Hoover's  in the supine position.  No bruits or organomegaly appreciated   MS:  Nl gait/ ext warm without deformities Or obvious joint restrictions  calf tenderness, cyanosis or clubbing     SKIN: warm and dry without lesions    NEURO:  alert, approp, nl sensorium with  no motor or cerebellar deficits apparent.          Assessment

## 2021-10-13 NOTE — Patient Instructions (Signed)
My office will be contacting you by phone for referral to lung cancer screening which can be done at [facility]    If you are satisfied with your treatment plan,  let your doctor know and he/she can either refill your medications or you can return here when your prescription runs out.     If in any way you are not 100% satisfied,  please tell us.  If 100% better, tell your friends!  Pulmonary follow up is as needed

## 2021-10-13 NOTE — Assessment & Plan Note (Addendum)
Referred for Screening Chest CT 10/13/2021 >>>   Low-dose CT lung cancer screening is recommended for patients who are 27-72 years of age with a 20+ pack-year history of smoking and who are currently smoking or quit <=15 years ago. No coughing up blood  No unintentional weight loss of > 15 pounds in the last 6 months - pt is eligible for scanning yearly until 2029 > referred

## 2021-10-22 DIAGNOSIS — I10 Essential (primary) hypertension: Secondary | ICD-10-CM | POA: Diagnosis not present

## 2021-10-22 DIAGNOSIS — E1165 Type 2 diabetes mellitus with hyperglycemia: Secondary | ICD-10-CM | POA: Diagnosis not present

## 2021-10-22 DIAGNOSIS — E782 Mixed hyperlipidemia: Secondary | ICD-10-CM | POA: Diagnosis not present

## 2021-10-22 DIAGNOSIS — F320 Major depressive disorder, single episode, mild: Secondary | ICD-10-CM | POA: Diagnosis not present

## 2021-10-22 DIAGNOSIS — N183 Chronic kidney disease, stage 3 unspecified: Secondary | ICD-10-CM | POA: Diagnosis not present

## 2021-10-22 DIAGNOSIS — Z0000 Encounter for general adult medical examination without abnormal findings: Secondary | ICD-10-CM | POA: Diagnosis not present

## 2021-10-22 DIAGNOSIS — E039 Hypothyroidism, unspecified: Secondary | ICD-10-CM | POA: Diagnosis not present

## 2021-10-22 DIAGNOSIS — M5136 Other intervertebral disc degeneration, lumbar region: Secondary | ICD-10-CM | POA: Diagnosis not present

## 2021-10-22 DIAGNOSIS — J449 Chronic obstructive pulmonary disease, unspecified: Secondary | ICD-10-CM | POA: Diagnosis not present

## 2021-10-22 DIAGNOSIS — G4733 Obstructive sleep apnea (adult) (pediatric): Secondary | ICD-10-CM | POA: Diagnosis not present

## 2021-11-19 DIAGNOSIS — L0291 Cutaneous abscess, unspecified: Secondary | ICD-10-CM | POA: Diagnosis not present

## 2021-12-11 ENCOUNTER — Other Ambulatory Visit: Payer: Self-pay

## 2021-12-11 DIAGNOSIS — Z122 Encounter for screening for malignant neoplasm of respiratory organs: Secondary | ICD-10-CM

## 2021-12-11 DIAGNOSIS — Z87891 Personal history of nicotine dependence: Secondary | ICD-10-CM

## 2022-01-13 ENCOUNTER — Encounter: Payer: Self-pay | Admitting: Acute Care

## 2022-01-13 ENCOUNTER — Ambulatory Visit (INDEPENDENT_AMBULATORY_CARE_PROVIDER_SITE_OTHER): Payer: Medicare PPO | Admitting: Acute Care

## 2022-01-13 DIAGNOSIS — Z87891 Personal history of nicotine dependence: Secondary | ICD-10-CM

## 2022-01-13 NOTE — Progress Notes (Addendum)
Virtual Visit via Telephone Note  I connected with Mathew Bernard on 03/11/21 at  2:00 PM EST by telephone and verified that I am speaking with the correct person using two identifiers.  Location: Patient: Home Provider: Working from home   I discussed the limitations, risks, security and privacy concerns of performing an evaluation and management service by telephone and the availability of in person appointments. I also discussed with the patient that there may be a patient responsible charge related to this service. The patient expressed understanding and agreed to proceed.  Shared Decision Making Visit Lung Cancer Screening Program [phone])   Eligibility: Age 71 y.o. Pack Years Smoking History Calculation 49 (# packs/per year x # years smoked) Recent History of coughing up blood  no Unexplained weight loss? no ( >Than 15 pounds within the last 6 months ) Prior History Lung / other cancer no (Diagnosis within the last 5 years already requiring surveillance chest CT Scans). Smoking Status Former Smoker Former Smokers: Years since quit: 9 years  Quit Date: 2014  Visit Components: Discussion included one or more decision making aids. yes Discussion included risk/benefits of screening. yes Discussion included potential follow up diagnostic testing for abnormal scans. yes Discussion included meaning and risk of over diagnosis. yes Discussion included meaning and risk of False Positives. yes Discussion included meaning of total radiation exposure. yes  Counseling Included: Importance of adherence to annual lung cancer LDCT screening. yes Impact of comorbidities on ability to participate in the program. yes Ability and willingness to under diagnostic treatment. yes  Smoking Cessation Counseling: Current Smokers:  Discussed importance of smoking cessation. yes Information about tobacco cessation classes and interventions provided to patient. yes Patient provided with "ticket"  for LDCT Scan. yes Symptomatic Patient. no  Counseling NA Diagnosis Code: Tobacco Use Z72.0 Asymptomatic Patient yes  Counseling NA Former Smokers:  Discussed the importance of maintaining cigarette abstinence. yes Diagnosis Code: Personal History of Nicotine Dependence. T01.601 Information about tobacco cessation classes and interventions provided to patient. Yes Patient provided with "ticket" for LDCT Scan. yes Written Order for Lung Cancer Screening with LDCT placed in Epic. Yes (CT Chest Lung Cancer Screening Low Dose W/O CM) [id number] Z12.2-Screening of respiratory organs Z87.891-Personal history of nicotine dependence   I spent 25 minutes of face to face time with him discussing the risks and benefits of lung cancer screening. We viewed a power point together that explained in detail the above noted topics. We took the time to pause the power point at intervals to allow for questions to be asked and answered to ensure understanding. We discussed that he had taken the single most powerful action possible to decrease his risk of developing lung cancer when he quit smoking. I counseled him to remain smoke free, and to contact me if he ever had the desire to smoke again so that I can provide resources and tools to help support the effort to remain smoke free. We discussed the time and location of the scan, and that either  [name] or I will call with the results within  24-48 hours of receiving them. He has my card and contact information in the event he needs to speak with me, in addition to a copy of the power point we reviewed as a resource. He verbalized understanding of all of the above and had no further questions upon leaving the office.     I explained to the patient that there has been a high incidence of  coronary artery disease noted on these exams. I explained that this is a non-gated exam therefore degree or severity cannot be determined. This patient is on statin therapy. I  have asked the patient to follow-up with their PCP regarding any incidental finding of coronary artery disease and management with diet or medication as they feel is clinically indicated. The patient verbalized understanding of the above and had no further questions.    Sahib Pella D. Kenton Kingfisher, NP-C [facility] Personal contact information can be found on Amion  01/13/2022, 9:01 AM

## 2022-01-13 NOTE — Patient Instructions (Signed)
Thank you for participating in the [facility] Screening Program. It was our pleasure to meet you today. We will call you with the results of your scan within the next few days. Your scan will be assigned a Lung RADS category score by the physicians reading the scans.  This Lung RADS score determines follow up scanning.  See below for description of categories, and follow up screening recommendations. We will be in touch to schedule your follow up screening annually or based on recommendations of our providers. We will fax a copy of your scan results to your Primary Care Physician, or the physician who referred you to the program, to ensure they have the results. Please call the office if you have any questions or concerns regarding your scanning experience or results.  Our office number is [phone]. Please speak with Denise Phelps, RN. , or  Denise Buckner RN, They are  our Lung Cancer Screening RN.'s If They are unavailable when you call, Please leave a message on the voice mail. We will return your call at our earliest convenience.This voice mail is monitored several times a day.  Remember, if your scan is normal, we will scan you annually as long as you continue to meet the criteria for the program. (Age 55-77, Current smoker or smoker who has quit within the last 15 years). If you are a smoker, remember, quitting is the single most powerful action that you can take to decrease your risk of lung cancer and other pulmonary, breathing related problems. We know quitting is hard, and we are here to help.  Please let us know if there is anything we can do to help you meet your goal of quitting. If you are a former smoker, congratulations. We are proud of you! Remain smoke free! Remember you can refer friends or family members through the number above.  We will screen them to make sure they meet criteria for the program. Thank you for helping us take better care of you by  participating in Lung Screening.  You can receive free nicotine replacement therapy ( patches, gum or mints) by calling 1-800-QUIT NOW. Please call so we can get you on the path to becoming  a non-smoker. I know it is hard, but you can do this!  Lung RADS Categories:  Lung RADS 1: no nodules or definitely non-concerning nodules.  Recommendation is for a repeat annual scan in 12 months.  Lung RADS 2:  nodules that are non-concerning in appearance and behavior with a very low likelihood of becoming an active cancer. Recommendation is for a repeat annual scan in 12 months.  Lung RADS 3: nodules that are probably non-concerning , includes nodules with a low likelihood of becoming an active cancer.  Recommendation is for a 6-month repeat screening scan. Often noted after an upper respiratory illness. We will be in touch to make sure you have no questions, and to schedule your 6-month scan.  Lung RADS 4 A: nodules with concerning findings, recommendation is most often for a follow up scan in 3 months or additional testing based on our provider's assessment of the scan. We will be in touch to make sure you have no questions and to schedule the recommended 3 month follow up scan.  Lung RADS 4 B:  indicates findings that are concerning. We will be in touch with you to schedule additional diagnostic testing based on our provider's  assessment of the scan.  Other options for assistance in smoking cessation (   As covered by your insurance benefits)  Hypnosis for smoking cessation  [employer] [phone]  Acupuncture for smoking cessation  [facility] [phone]   

## 2022-01-20 ENCOUNTER — Ambulatory Visit (HOSPITAL_COMMUNITY)
Admission: RE | Admit: 2022-01-20 | Discharge: 2022-01-20 | Disposition: A | Discharge status: Home / Self Care | Payer: Medicare PPO | Source: Ambulatory Visit | Attending: Acute Care | Admitting: Acute Care

## 2022-01-20 DIAGNOSIS — Z122 Encounter for screening for malignant neoplasm of respiratory organs: Secondary | ICD-10-CM | POA: Insufficient documentation

## 2022-01-20 DIAGNOSIS — I700 Atherosclerosis of aorta: Secondary | ICD-10-CM | POA: Insufficient documentation

## 2022-01-20 DIAGNOSIS — Z87891 Personal history of nicotine dependence: Secondary | ICD-10-CM | POA: Diagnosis not present

## 2022-01-20 DIAGNOSIS — I2510 Atherosclerotic heart disease of native coronary artery without angina pectoris: Secondary | ICD-10-CM | POA: Insufficient documentation

## 2022-01-22 ENCOUNTER — Other Ambulatory Visit: Payer: Self-pay

## 2022-01-22 DIAGNOSIS — Z87891 Personal history of nicotine dependence: Secondary | ICD-10-CM

## 2022-01-22 DIAGNOSIS — Z122 Encounter for screening for malignant neoplasm of respiratory organs: Secondary | ICD-10-CM

## 2022-01-27 DIAGNOSIS — M150 Primary generalized (osteo)arthritis: Secondary | ICD-10-CM | POA: Diagnosis not present

## 2022-01-27 DIAGNOSIS — E119 Type 2 diabetes mellitus without complications: Secondary | ICD-10-CM | POA: Diagnosis not present

## 2022-01-27 DIAGNOSIS — Z23 Encounter for immunization: Secondary | ICD-10-CM | POA: Diagnosis not present

## 2022-03-23 ENCOUNTER — Encounter: Payer: Self-pay | Admitting: *Deleted

## 2022-03-23 ENCOUNTER — Ambulatory Visit: Payer: Self-pay | Admitting: *Deleted

## 2022-03-23 NOTE — Patient Outreach (Signed)
  Care Coordination   Initial Visit Note   03/23/2022  Name: RIDLEY SCHEWE MRN: [id number] DOB: 03-Feb-1951  CHIBUIKEM THANG is a 71 y.o. year old male who sees Shawnie Dapper, [place] for primary care. I spoke with patient's wife, Levan Aloia by phone today.  What matters to the patients health and wellness today?  No Interventions Identified.   SDOH assessments and interventions completed:  Yes.  SDOH Interventions Today    Flowsheet Row Most Recent Value  SDOH Interventions   Food Insecurity Interventions Intervention Not Indicated, Other (Comment)  [Verified by Wife, Era Bumpers  Housing Interventions Intervention Not Indicated, Other (Comment)  [Verified by Wife, Era Bumpers  Transportation Interventions Intervention Not Indicated, Patient Resources (Friends/Family), Other (Comment)  [Verified by Wife, Sheralyn Boatman [place]]  Utilities Interventions Intervention Not Indicated, Other (Comment)  [Verified by Wife, Sheralyn Boatman [place]]  Alcohol Usage Interventions Intervention Not Indicated (Score <7), Other (Comment)  [Verified by Wife, Sheralyn Boatman [place]]  Financial Strain Interventions Intervention Not Indicated, Other (Comment)  [Verified by Wife, Era Bumpers  Physical Activity Interventions Intervention Not Indicated, Other (Comments)  [Verified by Wife, Sheralyn Boatman [place]]  Stress Interventions Intervention Not Indicated, Other (Comment)  [Verified by Wife, Sheralyn Boatman [place]]  Social Connections Interventions Intervention Not Indicated, Other (Comment)  [Verified by Wife, Sheralyn Boatman [place]]     Care Coordination Interventions:  Yes, provided.   Follow up plan: No further intervention required.   Encounter Outcome:  Pt. Visit Completed.   Danford Bad, BSW, MSW, LCSW  Licensed Restaurant manager, [employer] [facility]  [address] [place] [address] [place], [place] [address] Physical Address[address], [place], [place] [address] Toll Free Main #  [phone] Fax # [phone] Cell # [phone] [email or web address]

## 2022-03-23 NOTE — Patient Instructions (Signed)
Visit Information  Thank you for taking time to visit with me today. Please don't hesitate to contact me if I can be of assistance to you.   Please call the care guide team at [phone] if you need to cancel or reschedule your appointment.   If you are experiencing a Mental Health or Behavioral Health Crisis or need someone to talk to, please call the Suicide and Crisis Lifeline: 988 call the [place] National Suicide Prevention Lifeline: 1-800-273-8255 or TTY: 1-800-799-4 TTY (1-800-799-4889) to talk to a trained counselor call 1-800-273-TALK (toll free, 24 hour hotline) go to [facility] [address], [place] ([phone]) call the [place] Crisis Line: 800-939-9988 call 911  Patient verbalizes understanding of instructions and care plan provided today and agrees to view in MyChart. Active MyChart status and patient understanding of how to access instructions and care plan via MyChart confirmed with patient.     No further follow up required.  Herson Prichard, BSW, MSW, LCSW  Licensed Clinical Social Worker  [facility]  Mailing Address-[address], [place], [place] [address] Physical Address-[address], [place], [place] [address] Toll Free Main # [phone] Fax # [phone] Cell # [phone] [email or web address]            

## 2022-05-07 DIAGNOSIS — G4733 Obstructive sleep apnea (adult) (pediatric): Secondary | ICD-10-CM | POA: Diagnosis not present

## 2022-05-15 DIAGNOSIS — H04123 Dry eye syndrome of bilateral lacrimal glands: Secondary | ICD-10-CM | POA: Diagnosis not present

## 2022-05-15 DIAGNOSIS — H35033 Hypertensive retinopathy, bilateral: Secondary | ICD-10-CM | POA: Diagnosis not present

## 2022-05-15 DIAGNOSIS — H353111 Nonexudative age-related macular degeneration, right eye, early dry stage: Secondary | ICD-10-CM | POA: Diagnosis not present

## 2022-05-15 DIAGNOSIS — E119 Type 2 diabetes mellitus without complications: Secondary | ICD-10-CM | POA: Diagnosis not present

## 2022-05-26 DIAGNOSIS — E782 Mixed hyperlipidemia: Secondary | ICD-10-CM | POA: Diagnosis not present

## 2022-05-26 DIAGNOSIS — E039 Hypothyroidism, unspecified: Secondary | ICD-10-CM | POA: Diagnosis not present

## 2022-05-26 DIAGNOSIS — E1165 Type 2 diabetes mellitus with hyperglycemia: Secondary | ICD-10-CM | POA: Diagnosis not present

## 2022-05-26 DIAGNOSIS — Z125 Encounter for screening for malignant neoplasm of prostate: Secondary | ICD-10-CM | POA: Diagnosis not present

## 2022-05-26 DIAGNOSIS — R42 Dizziness and giddiness: Secondary | ICD-10-CM | POA: Diagnosis not present

## 2022-05-26 DIAGNOSIS — K219 Gastro-esophageal reflux disease without esophagitis: Secondary | ICD-10-CM | POA: Diagnosis not present

## 2022-05-26 DIAGNOSIS — G4733 Obstructive sleep apnea (adult) (pediatric): Secondary | ICD-10-CM | POA: Diagnosis not present

## 2022-05-26 DIAGNOSIS — F324 Major depressive disorder, single episode, in partial remission: Secondary | ICD-10-CM | POA: Diagnosis not present

## 2022-05-26 DIAGNOSIS — I10 Essential (primary) hypertension: Secondary | ICD-10-CM | POA: Diagnosis not present

## 2022-06-25 DIAGNOSIS — Z1212 Encounter for screening for malignant neoplasm of rectum: Secondary | ICD-10-CM | POA: Diagnosis not present

## 2022-06-25 DIAGNOSIS — Z1211 Encounter for screening for malignant neoplasm of colon: Secondary | ICD-10-CM | POA: Diagnosis not present

## 2022-06-25 DIAGNOSIS — E1165 Type 2 diabetes mellitus with hyperglycemia: Secondary | ICD-10-CM | POA: Diagnosis not present

## 2022-06-25 DIAGNOSIS — Z0000 Encounter for general adult medical examination without abnormal findings: Secondary | ICD-10-CM | POA: Diagnosis not present

## 2022-06-25 DIAGNOSIS — I10 Essential (primary) hypertension: Secondary | ICD-10-CM | POA: Diagnosis not present

## 2022-06-25 DIAGNOSIS — E039 Hypothyroidism, unspecified: Secondary | ICD-10-CM | POA: Diagnosis not present

## 2022-07-07 DIAGNOSIS — Z1212 Encounter for screening for malignant neoplasm of rectum: Secondary | ICD-10-CM | POA: Diagnosis not present

## 2022-07-07 DIAGNOSIS — Z1211 Encounter for screening for malignant neoplasm of colon: Secondary | ICD-10-CM | POA: Diagnosis not present

## 2022-07-14 LAB — COLOGUARD: COLOGUARD: NEGATIVE

## 2022-09-01 DIAGNOSIS — I10 Essential (primary) hypertension: Secondary | ICD-10-CM | POA: Diagnosis not present

## 2022-09-01 DIAGNOSIS — G4733 Obstructive sleep apnea (adult) (pediatric): Secondary | ICD-10-CM | POA: Diagnosis not present

## 2022-09-01 DIAGNOSIS — Z8673 Personal history of transient ischemic attack (TIA), and cerebral infarction without residual deficits: Secondary | ICD-10-CM | POA: Diagnosis not present

## 2022-09-01 DIAGNOSIS — E1165 Type 2 diabetes mellitus with hyperglycemia: Secondary | ICD-10-CM | POA: Diagnosis not present

## 2022-09-01 DIAGNOSIS — E039 Hypothyroidism, unspecified: Secondary | ICD-10-CM | POA: Diagnosis not present

## 2022-09-10 IMAGING — DX DG CHEST 2V
2 series · 2 of 2 positions shown · non-contrast
Comparison: Chest radiograph 06/19/2020.

CLINICAL DATA: Patient with cough.

EXAM:
CHEST - 2 VIEW

[chest pa]
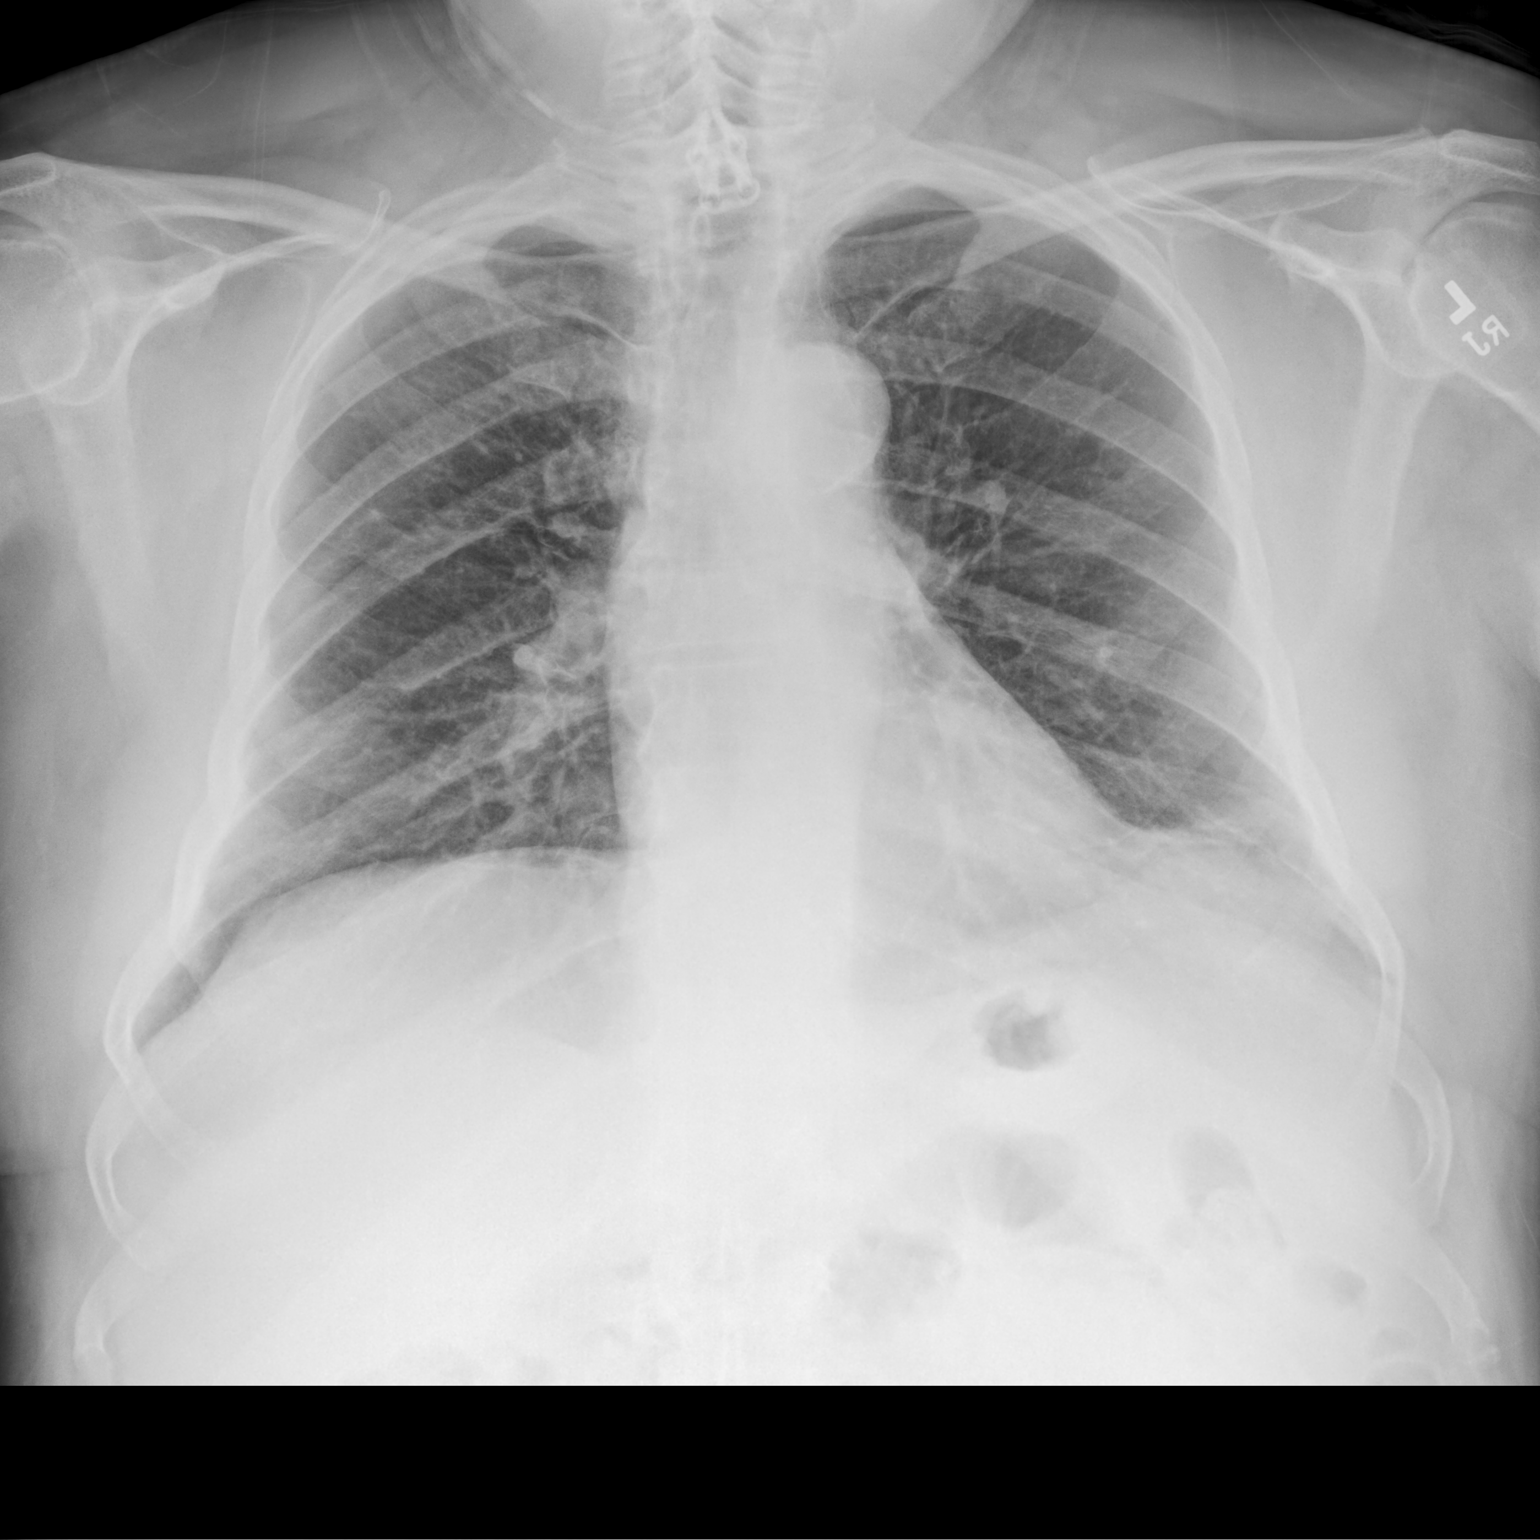

[chest lat]
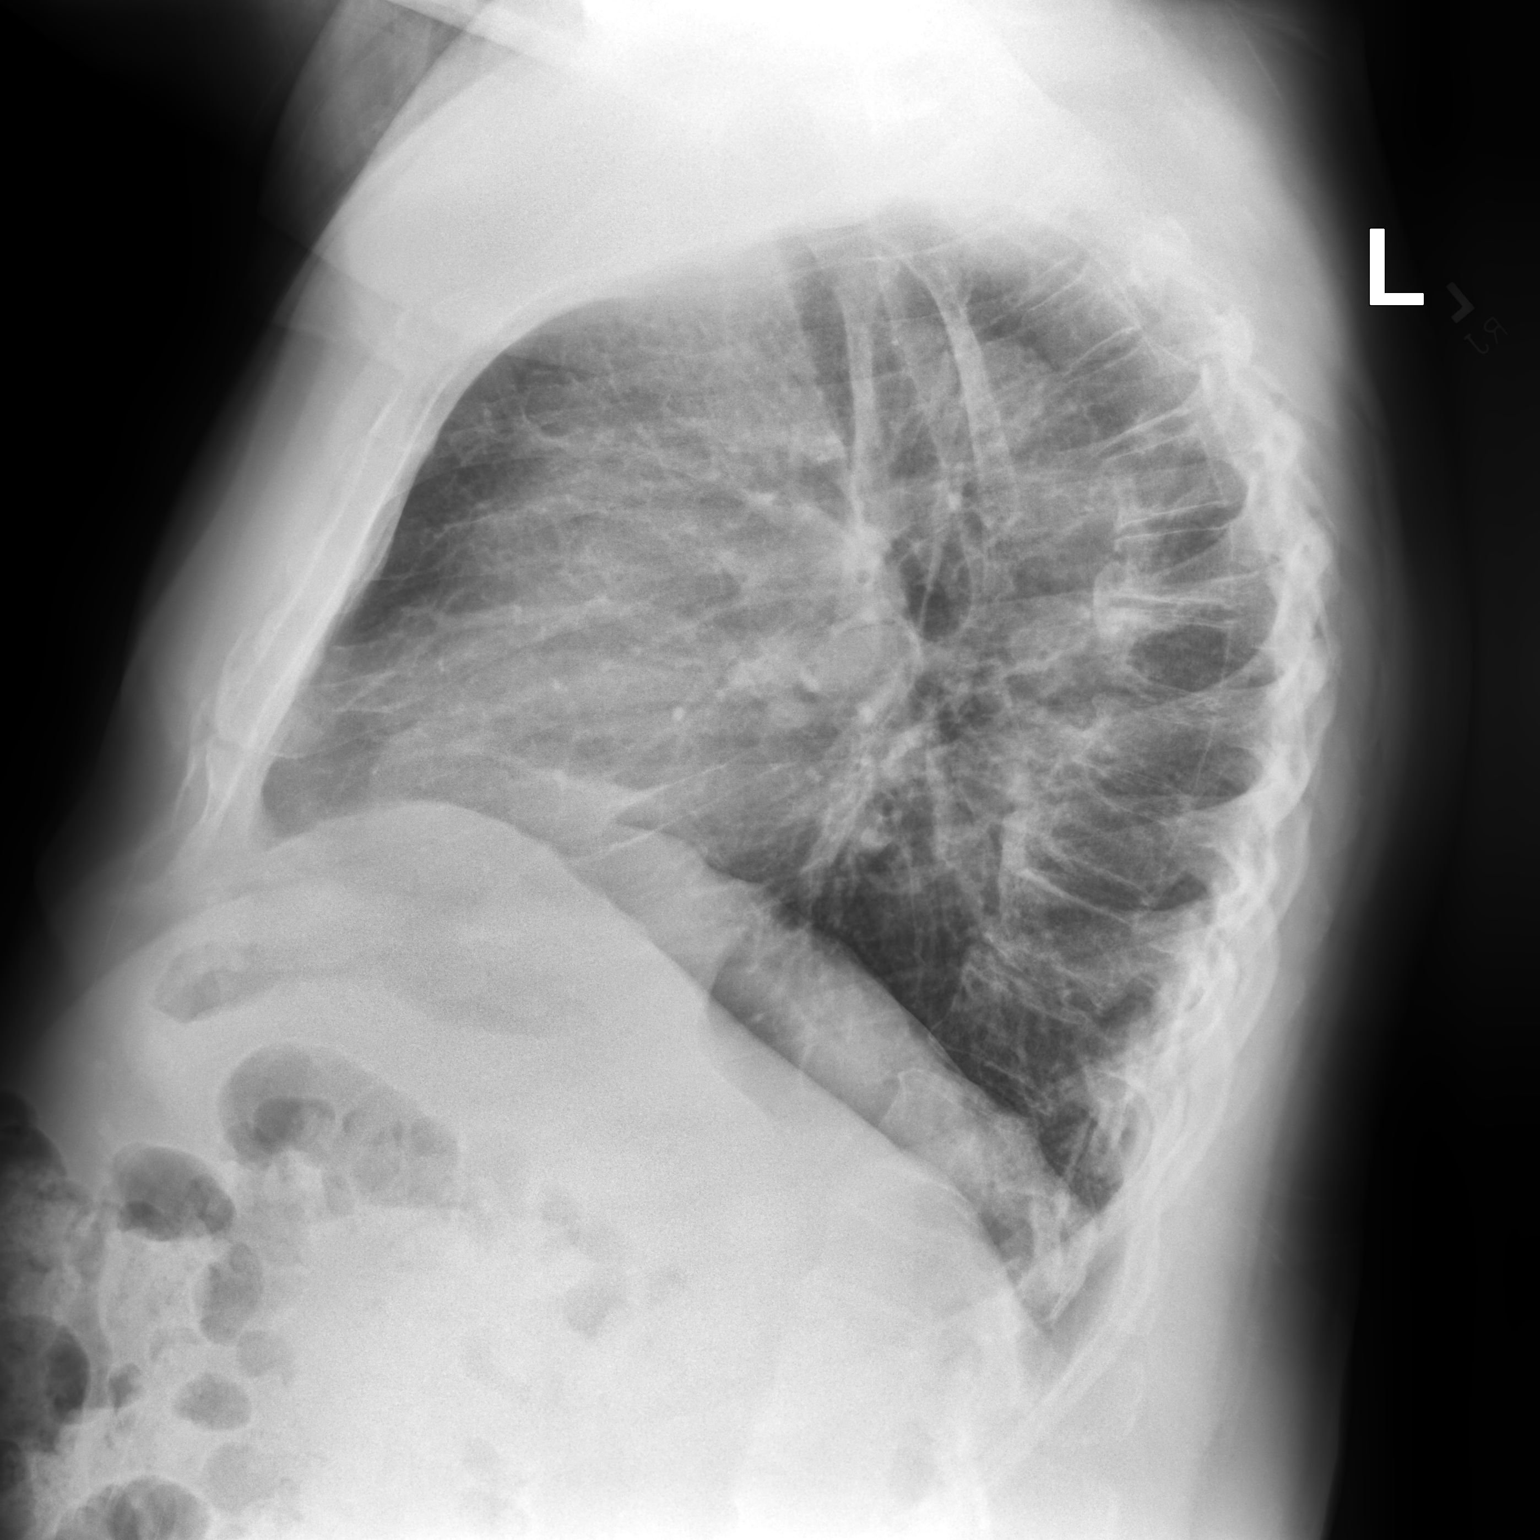

[2 of 2 positions shown; findings below may reference images not displayed]

FINDINGS: Stable cardiac and mediastinal contours. Minimal left basilar
atelectasis. No pleural effusion or pneumothorax. Thoracic spine
degenerative changes.
IMPRESSION: No active cardiopulmonary disease.

## 2022-10-05 DIAGNOSIS — L723 Sebaceous cyst: Secondary | ICD-10-CM | POA: Diagnosis not present

## 2022-10-05 DIAGNOSIS — L089 Local infection of the skin and subcutaneous tissue, unspecified: Secondary | ICD-10-CM | POA: Diagnosis not present

## 2022-10-05 DIAGNOSIS — N1831 Chronic kidney disease, stage 3a: Secondary | ICD-10-CM | POA: Diagnosis not present

## 2022-10-05 DIAGNOSIS — I10 Essential (primary) hypertension: Secondary | ICD-10-CM | POA: Diagnosis not present

## 2022-10-13 DIAGNOSIS — L720 Epidermal cyst: Secondary | ICD-10-CM | POA: Diagnosis not present

## 2023-01-22 ENCOUNTER — Ambulatory Visit (HOSPITAL_COMMUNITY)
Admission: RE | Admit: 2023-01-22 | Discharge: 2023-01-22 | Disposition: A | Discharge status: Home / Self Care | Payer: Medicare PPO | Source: Ambulatory Visit | Attending: Physician Assistant | Admitting: Physician Assistant

## 2023-01-22 DIAGNOSIS — Z87891 Personal history of nicotine dependence: Secondary | ICD-10-CM | POA: Diagnosis present

## 2023-01-22 DIAGNOSIS — Z122 Encounter for screening for malignant neoplasm of respiratory organs: Secondary | ICD-10-CM | POA: Diagnosis present

## 2023-02-08 ENCOUNTER — Other Ambulatory Visit: Payer: Self-pay

## 2023-02-08 DIAGNOSIS — Z122 Encounter for screening for malignant neoplasm of respiratory organs: Secondary | ICD-10-CM

## 2023-02-08 DIAGNOSIS — Z87891 Personal history of nicotine dependence: Secondary | ICD-10-CM

## 2023-07-01 ENCOUNTER — Ambulatory Visit: Payer: Medicare PPO | Admitting: Internal Medicine

## 2024-01-06 ENCOUNTER — Telehealth: Payer: Self-pay

## 2024-01-06 NOTE — Telephone Encounter (Signed)
 LVM to call office and schedule annual Lung CT.

## 2024-01-25 ENCOUNTER — Ambulatory Visit (HOSPITAL_COMMUNITY)
Admission: RE | Admit: 2024-01-25 | Discharge: 2024-01-25 | Disposition: A | Discharge status: Home / Self Care | Source: Ambulatory Visit | Attending: Acute Care | Admitting: Acute Care

## 2024-01-25 DIAGNOSIS — Z122 Encounter for screening for malignant neoplasm of respiratory organs: Secondary | ICD-10-CM | POA: Diagnosis present

## 2024-01-25 DIAGNOSIS — Z87891 Personal history of nicotine dependence: Secondary | ICD-10-CM | POA: Insufficient documentation

## 2024-04-07 ENCOUNTER — Other Ambulatory Visit: Payer: Self-pay | Admitting: Acute Care

## 2024-04-07 DIAGNOSIS — Z87891 Personal history of nicotine dependence: Secondary | ICD-10-CM

## 2024-04-07 DIAGNOSIS — Z122 Encounter for screening for malignant neoplasm of respiratory organs: Secondary | ICD-10-CM
